# Patient Record
Sex: Male | Born: 1965 | Race: White | Hispanic: No | Marital: Single | Smoking: Former smoker
Health system: Southern US, Community
[De-identification: ages and names within clinical notes are randomized; demographics above are authoritative.]

## PROBLEM LIST (undated history)

## (undated) DIAGNOSIS — Z86718 Personal history of other venous thrombosis and embolism: Secondary | ICD-10-CM

## (undated) DIAGNOSIS — T7840XA Allergy, unspecified, initial encounter: Secondary | ICD-10-CM

## (undated) DIAGNOSIS — D649 Anemia, unspecified: Secondary | ICD-10-CM

## (undated) DIAGNOSIS — IMO0002 Reserved for concepts with insufficient information to code with codable children: Secondary | ICD-10-CM

## (undated) DIAGNOSIS — G4733 Obstructive sleep apnea (adult) (pediatric): Secondary | ICD-10-CM

## (undated) DIAGNOSIS — K8020 Calculus of gallbladder without cholecystitis without obstruction: Secondary | ICD-10-CM

## (undated) DIAGNOSIS — K219 Gastro-esophageal reflux disease without esophagitis: Secondary | ICD-10-CM

## (undated) DIAGNOSIS — K5790 Diverticulosis of intestine, part unspecified, without perforation or abscess without bleeding: Secondary | ICD-10-CM

## (undated) DIAGNOSIS — K760 Fatty (change of) liver, not elsewhere classified: Secondary | ICD-10-CM

## (undated) DIAGNOSIS — K449 Diaphragmatic hernia without obstruction or gangrene: Secondary | ICD-10-CM

## (undated) DIAGNOSIS — K2990 Gastroduodenitis, unspecified, without bleeding: Secondary | ICD-10-CM

## (undated) DIAGNOSIS — Z800 Family history of malignant neoplasm of digestive organs: Secondary | ICD-10-CM

## (undated) DIAGNOSIS — F419 Anxiety disorder, unspecified: Secondary | ICD-10-CM

## (undated) DIAGNOSIS — K2970 Gastritis, unspecified, without bleeding: Secondary | ICD-10-CM

## (undated) DIAGNOSIS — K635 Polyp of colon: Secondary | ICD-10-CM

## (undated) DIAGNOSIS — K5792 Diverticulitis of intestine, part unspecified, without perforation or abscess without bleeding: Secondary | ICD-10-CM

## (undated) HISTORY — DX: Diverticulitis of intestine, part unspecified, without perforation or abscess without bleeding: K57.92

## (undated) HISTORY — DX: Calculus of gallbladder without cholecystitis without obstruction: K80.20

## (undated) HISTORY — DX: Polyp of colon: K63.5

## (undated) HISTORY — DX: Anemia, unspecified: D64.9

## (undated) HISTORY — DX: Gastroduodenitis, unspecified, without bleeding: K29.90

## (undated) HISTORY — PX: WISDOM TOOTH EXTRACTION: SHX21

## (undated) HISTORY — DX: Personal history of other venous thrombosis and embolism: Z86.718

## (undated) HISTORY — DX: Family history of malignant neoplasm of digestive organs: Z80.0

## (undated) HISTORY — DX: Allergy, unspecified, initial encounter: T78.40XA

## (undated) HISTORY — DX: Anxiety disorder, unspecified: F41.9

## (undated) HISTORY — PX: ELBOW SURGERY: SHX618

## (undated) HISTORY — DX: Gastritis, unspecified, without bleeding: K29.70

## (undated) HISTORY — DX: Diverticulosis of intestine, part unspecified, without perforation or abscess without bleeding: K57.90

## (undated) HISTORY — DX: Reserved for concepts with insufficient information to code with codable children: IMO0002

## (undated) HISTORY — DX: Diaphragmatic hernia without obstruction or gangrene: K44.9

## (undated) HISTORY — DX: Fatty (change of) liver, not elsewhere classified: K76.0

## (undated) HISTORY — DX: Obstructive sleep apnea (adult) (pediatric): G47.33

## (undated) HISTORY — DX: Gastro-esophageal reflux disease without esophagitis: K21.9

---

## 2002-03-07 ENCOUNTER — Encounter: Payer: Self-pay | Admitting: Emergency Medicine

## 2002-03-07 ENCOUNTER — Emergency Department (HOSPITAL_COMMUNITY): Admission: EM | Admit: 2002-03-07 | Discharge: 2002-03-07 | Payer: Self-pay | Admitting: Emergency Medicine

## 2002-03-09 ENCOUNTER — Encounter: Payer: Self-pay | Admitting: Orthopedic Surgery

## 2002-03-09 ENCOUNTER — Ambulatory Visit (HOSPITAL_COMMUNITY): Admission: RE | Admit: 2002-03-09 | Discharge: 2002-03-09 | Payer: Self-pay | Admitting: Orthopedic Surgery

## 2002-03-14 ENCOUNTER — Encounter: Payer: Self-pay | Admitting: Orthopedic Surgery

## 2002-03-14 ENCOUNTER — Ambulatory Visit (HOSPITAL_COMMUNITY): Admission: RE | Admit: 2002-03-14 | Discharge: 2002-03-14 | Payer: Self-pay | Admitting: Orthopedic Surgery

## 2002-04-24 ENCOUNTER — Inpatient Hospital Stay (HOSPITAL_COMMUNITY): Admission: RE | Admit: 2002-04-24 | Discharge: 2002-04-25 | Payer: Self-pay | Admitting: Orthopedic Surgery

## 2003-07-27 ENCOUNTER — Ambulatory Visit (HOSPITAL_COMMUNITY): Admission: RE | Admit: 2003-07-27 | Discharge: 2003-07-27 | Payer: Self-pay | Admitting: Gastroenterology

## 2003-07-30 ENCOUNTER — Inpatient Hospital Stay (HOSPITAL_COMMUNITY): Admission: EM | Admit: 2003-07-30 | Discharge: 2003-07-31 | Payer: Self-pay | Admitting: Emergency Medicine

## 2004-10-07 ENCOUNTER — Ambulatory Visit: Payer: Self-pay | Admitting: Gastroenterology

## 2004-10-28 ENCOUNTER — Ambulatory Visit: Payer: Self-pay | Admitting: Gastroenterology

## 2004-11-25 ENCOUNTER — Ambulatory Visit: Payer: Self-pay | Admitting: Gastroenterology

## 2005-08-04 ENCOUNTER — Ambulatory Visit: Payer: Self-pay | Admitting: Cardiology

## 2005-08-05 ENCOUNTER — Ambulatory Visit: Payer: Self-pay | Admitting: Pulmonary Disease

## 2005-08-13 ENCOUNTER — Ambulatory Visit: Payer: Self-pay | Admitting: Cardiology

## 2005-08-20 ENCOUNTER — Ambulatory Visit: Payer: Self-pay | Admitting: Cardiology

## 2005-08-27 ENCOUNTER — Ambulatory Visit: Payer: Self-pay | Admitting: *Deleted

## 2005-09-14 DIAGNOSIS — K2990 Gastroduodenitis, unspecified, without bleeding: Secondary | ICD-10-CM

## 2005-09-14 DIAGNOSIS — K2970 Gastritis, unspecified, without bleeding: Secondary | ICD-10-CM

## 2005-09-14 DIAGNOSIS — K449 Diaphragmatic hernia without obstruction or gangrene: Secondary | ICD-10-CM

## 2005-09-14 HISTORY — DX: Diaphragmatic hernia without obstruction or gangrene: K44.9

## 2005-09-14 HISTORY — DX: Gastritis, unspecified, without bleeding: K29.70

## 2005-09-14 HISTORY — DX: Gastroduodenitis, unspecified, without bleeding: K29.90

## 2005-09-18 ENCOUNTER — Ambulatory Visit: Payer: Self-pay | Admitting: Cardiology

## 2005-11-12 ENCOUNTER — Ambulatory Visit: Payer: Self-pay | Admitting: Cardiology

## 2005-12-10 ENCOUNTER — Ambulatory Visit: Payer: Self-pay | Admitting: Cardiology

## 2005-12-11 ENCOUNTER — Emergency Department (HOSPITAL_COMMUNITY): Admission: EM | Admit: 2005-12-11 | Discharge: 2005-12-12 | Payer: Self-pay | Admitting: Emergency Medicine

## 2005-12-15 ENCOUNTER — Ambulatory Visit: Payer: Self-pay | Admitting: Gastroenterology

## 2005-12-16 ENCOUNTER — Ambulatory Visit: Payer: Self-pay | Admitting: Gastroenterology

## 2006-01-06 ENCOUNTER — Ambulatory Visit: Payer: Self-pay | Admitting: Pulmonary Disease

## 2006-04-02 ENCOUNTER — Emergency Department (HOSPITAL_COMMUNITY): Admission: EM | Admit: 2006-04-02 | Discharge: 2006-04-03 | Payer: Self-pay | Admitting: Emergency Medicine

## 2010-04-17 ENCOUNTER — Encounter: Payer: Self-pay | Admitting: Nurse Practitioner

## 2010-04-21 ENCOUNTER — Encounter: Payer: Self-pay | Admitting: Nurse Practitioner

## 2010-04-22 ENCOUNTER — Telehealth: Payer: Self-pay | Admitting: Gastroenterology

## 2010-04-24 ENCOUNTER — Ambulatory Visit: Payer: Self-pay | Admitting: Gastroenterology

## 2010-04-24 ENCOUNTER — Encounter: Payer: Self-pay | Admitting: Nurse Practitioner

## 2010-04-24 DIAGNOSIS — R195 Other fecal abnormalities: Secondary | ICD-10-CM

## 2010-04-24 DIAGNOSIS — K219 Gastro-esophageal reflux disease without esophagitis: Secondary | ICD-10-CM

## 2010-04-24 DIAGNOSIS — R944 Abnormal results of kidney function studies: Secondary | ICD-10-CM | POA: Insufficient documentation

## 2010-04-24 DIAGNOSIS — R1013 Epigastric pain: Secondary | ICD-10-CM

## 2010-05-07 ENCOUNTER — Telehealth: Payer: Self-pay | Admitting: Gastroenterology

## 2010-05-07 ENCOUNTER — Ambulatory Visit: Payer: Self-pay | Admitting: Gastroenterology

## 2010-05-07 LAB — CONVERTED CEMR LAB: UREASE: NEGATIVE

## 2010-05-08 ENCOUNTER — Ambulatory Visit (HOSPITAL_COMMUNITY): Admission: RE | Admit: 2010-05-08 | Discharge: 2010-05-08 | Payer: Self-pay | Admitting: Gastroenterology

## 2010-05-09 ENCOUNTER — Encounter: Payer: Self-pay | Admitting: Gastroenterology

## 2010-10-14 NOTE — Letter (Signed)
Summary: Patient [facility] Biopsy Results  [facility]  [address] [place], [place] [address]   Phone: [phone]  Fax: [phone]        May 09, 2010 MRN: [id number]    Ronald Mcintosh [address] [place], [place]  [address]    Dear Mr. Bitton,  I am pleased to inform you that the biopsies taken during your recent endoscopic examination did not show any evidence of cancer upon pathologic examination.  Additional information/recommendations:  __No further action is needed at this time.  Please follow-up with      your primary care physician for your other healthcare needs.  __ Please call [phone] to schedule a return visit to review      your condition.  x__ Continue with the treatment plan as outlined on the day of your      exam.No H. pylori noted on gastric biopsy  __ You should have a repeat endoscopic examination for this problem              in _ months/years.   Please call us if you are having persistent problems or have questions about your condition that have not been fully answered at this time.  Sincerely,  Mardella Layman MD [facility]  This letter has been electronically signed by your physician.  Appended Document: Patient Notice-Endo Biopsy Results letter mailed

## 2010-10-14 NOTE — Progress Notes (Signed)
Summary: [place] needed  Phone Note Outgoing Call   Summary of Call: Per Dr. Jarold Motto.  Pt needs [place] abd this week.  See EGD report. Initial call taken by: Ashok Cordia RN,  May 07, 2010 11:23 AM  Follow-up for Phone Call        [place] scheduled.  order completed. Nurse in [facility] notified. Follow-up by: Ashok Cordia RN,  May 07, 2010 11:25 AM

## 2010-10-14 NOTE — Progress Notes (Signed)
Summary: triage  Phone Note Call from Patient Call back at [phone]   Caller: Patient Call For: Jarold Motto Reason for Call: Talk to Nurse Summary of Call: Patient wants to be seen before his appt 9-20 for blood in stools and abd pain. Initial call taken by: Tawni Levy,  April 22, 2010 5:00 PM  Follow-up for Phone Call        Pt has been having pain in mid abd and has noticed some blood in stool for past 2 weeks.  Pt went to PCP and did stool chedks X3.  All were positive.  PCP suggested pt see GI.  Appt sch for pt to see Rozetta Nunnery 04/24/10. Follow-up by: Ashok Cordia RN,  April 23, 2010 8:48 AM

## 2010-10-14 NOTE — Miscellaneous (Signed)
Summary: Clotest  Clinical Lists Changes  Orders: Added new Test order of TLB-H Pylori Screen Gastric Biopsy (83013-CLOTEST) - Signed 

## 2010-10-14 NOTE — Assessment & Plan Note (Signed)
Summary: Abd pain/ Blood in stool/ dfs   History of Present Illness Visit Type: Initial Visit Primary GI MD: Melvia Heaps MD [facility] Primary Provider: [facility] Chief Complaint: Abdominal pain, hem positive stool History of Present Illness:   Last seen in 2007 for abdominal pain, black, heme positive stools on Coumadin. EGD revealed gastritis. Patient's hemoglobin stayed in normal range at the time. Patient has a history of GERD, asymptomatic if sleeps with HOB elevated and takes PPI twice daily.   Patient referred here by PCP for evaluation of heme positive stool. Saw PCP for two week history of epigastric pain and "dark stool", patient tells me it was black.  Pain felt similiar to what he experienced in 2007 except this time it radiated through to mid back. For last three days he has felt fine. He works out so much, not sure if back pain secondary to that. No nausea or vomiting. Labs 04/21/10 reveal Hgb of 17, MCV of 86. BUN normal, creatinine elevated at 1.54. On Cipro since Monday.    GI Review of Systems    Reports abdominal pain.     Location of  Abdominal pain: epigastric area.    Denies acid reflux, belching, bloating, chest pain, dysphagia with liquids, dysphagia with solids, heartburn, loss of appetite, nausea, vomiting, vomiting blood, weight loss, and  weight gain.      Reports heme positive stool.     Denies anal fissure, black tarry stools, change in bowel habit, constipation, diarrhea, diverticulosis, fecal incontinence, hemorrhoids, irritable bowel syndrome, jaundice, light color stool, liver problems, rectal bleeding, and  rectal pain. Preventive Screening-Counseling & Management  Alcohol-Tobacco     Smoking Status: quit      Drug Use:  no.      Current Medications (verified): 1)  Protonix 40 Mg Tbec (Pantoprazole Sodium) .... Two Times A Day 2)  Wellbutrin Xl 300 Mg Xr24h-Tab (Bupropion Hcl) .... Once Daily 3)  Flomax 0.4 Mg Caps (Tamsulosin Hcl) .... At  Bedtime 4)  Testim 1 % Gel (Testosterone) .... Apply As Directed 5)  Cipro 500 Mg Tabs (Ciprofloxacin Hcl) .... Two Times A Day  Allergies (verified): No Known Drug Allergies  Past History:  Past Medical History: hiatal hernia GI Bleed(2007) ? 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  Alcohol Use - no Daily Caffeine Use Illicit Drug Use - no Recovering addict crack  cocaineSmoking Status:  quit Drug Use:  no  Review of Systems       The patient complains of depression-new and fatigue.  The patient denies allergy/sinus, anemia, anxiety-new, arthritis/joint pain, back pain, blood in urine, breast changes/lumps, change in vision, confusion, cough, coughing up blood, fainting, fever, headaches-new, hearing problems, heart murmur, heart rhythm changes, itching, menstrual pain, muscle pains/cramps, night sweats, nosebleeds, pregnancy symptoms, shortness of breath, skin rash, sleeping problems, sore throat, swelling of feet/legs, swollen lymph glands, thirst - excessive , urination - excessive , urination changes/pain, urine leakage, vision changes, and voice change.    Vital Signs:  Patient profile:   45 year old male Height:      67 inches Weight:      217.50 pounds BMI:     34.19 Pulse rate:   88 / minute Pulse rhythm:   regular BP sitting:   112 / 68  (left arm) Cuff size:   large  Vitals Entered By: June  McMurray CMA Duncan Dull) (April 24, 2010 2:08 PM)  Physical Exam  General:  Well developed, well nourished, no acute distress. Head:  Normocephalic and atraumatic. Eyes:  Conjunctiva pink, no icterus.  Neck:  no obvious masses  Lungs:  Clear throughout to auscultation. Heart:  Regular rate and rhythm; no murmurs, rubs,  or bruits. Abdomen:  Abdomen soft, nontender, nondistended. No obvious masses or hepatomegaly.Normal bowel  sounds.  Msk:  Symmetrical with no gross deformities. Normal posture. Extremities:  No palmar erythema, no edema.  Neurologic:  Alert and  oriented x4;  grossly normal neurologically. Skin:  Intact without significant lesions or rashes. Cervical Nodes:  No significant cervical adenopathy. Psych:  Alert and cooperative. Normal mood and affect.   Impression & Recommendations:  Problem # 1:  BLOOD IN STOOL, OCCULT (ICD-792.1) Assessment New Passage of one black stool recently. CBC normal. History of low volume GI bleed on Coumadin in 2007. EGD revealed only gastritis. Patient is having recurrent epigastric pain and has been taking a small amount of NSAIDs. He is on PPI but PUD isn't excluded. Rule out biliary disease as this time pain seems to radiate through to back. The patient will be scheduled for an EGD with biopsies ( if indicated).  The risks and benefits of the procedure, as well as alternatives were discussed with the patient and he agrees to proceed. If EGD negative may need U/S abdomen for further evaluation of pain and colonoscopy for evaluation of heme + stool.   (EGD)  Problem # 2:  ABDOMINAL PAIN-EPIGASTRIC (ICD-789.06) Assessment: Deteriorated See #1. Patient started on Cipro, I am not sure why but will continue that under direction of PCP.  Problem # 3:  NONSPECIFIC ABNORM RESULTS KIDNEY FUNCTION STUDY (ICD-794.4) Assessment: Comment Only Creatinine 1.54 on 04/21/10.  Problem # 4:  GERD (ICD-530.81) Assessment: Comment Only Asymptomatic on twice daily PPI>  Other Orders: EGD (EGD)  Patient Instructions: 1)  Stop taking whatever NSAIDS you are taking such as Advil, Aleve.  2)  We scheduled you for an Endoscopy with Dr. Sheryn Bison. 3)  Directions and brochure provided. 4)   Endoscopy Center Patient Information Guide given to patient. 5)  cc: [facility] 6)  The medication list was reviewed and reconciled.  All changed / newly prescribed  medications were explained.  A complete medication list was provided to the patient / caregiver.

## 2010-10-14 NOTE — Procedures (Signed)
Summary: Upper Endoscopy  Patient: Ronald Mcintosh Note: All result statuses are Final unless otherwise noted.  Tests: (1) Upper Endoscopy (EGD)   EGD Upper Endoscopy       DONE     [facility]     [address]     [place], [place]  [address]           ENDOSCOPY PROCEDURE REPORT           PATIENT:  [name], [name]  MR#:  [id number]     BIRTHDATE:  20-Feb-1966, 44 yrs. old  GENDER:  male           ENDOSCOPIST:  Vania Rea. Jarold Motto, MD, [facility]     Referred by:           PROCEDURE DATE:  05/07/2010     PROCEDURE:  EGD with biopsy     ASA CLASS:  Class II     INDICATIONS:  abdominal pain, melena           MEDICATIONS:   Fentanyl 75 mcg IV, Versed 9 mg IV     TOPICAL ANESTHETIC:  Exactacain Spray           DESCRIPTION OF PROCEDURE:   After the risks benefits and     alternatives of the procedure were thoroughly explained, informed     consent was obtained.  The LB GIF-H180 [id number] endoscope was     introduced through the mouth and advanced to the second portion of     the duodenum, without limitations.  The instrument was slowly     withdrawn as the mucosa was fully examined.     <<PROCEDUREIMAGES>>           Normal GE junction was noted.  Normal duodenal folds were noted.     The stomach was entered and closely examined. The antrum,     angularis, and lesser curvature were well visualized, including a     retroflexed view of the cardia and fundus. The stomach wall was     normally distensable. The scope passed easily through the pylorus     into the duodenum. gastric polyps biopsied and CLO BX. DONE.     Retroflexed views revealed no abnormalities.    The scope was then     withdrawn from the patient and the procedure completed.     COMPLICATIONS:  None           ENDOSCOPIC IMPRESSION:     1) Normal GE junction     2) Normal duodenal folds     3) Normal stomach     NO CAUSE FOR ++ STOOLS.R/O LOWER GI SOURCE.     RECOMMENDATIONS:     1) Await biopsy results  SCHEDULE COLON EXAM.           REPEAT EXAM:  No           ______________________________     Vania Rea. Jarold Motto, MD, [facility]           CC:           n.     eSIGNED:   Vania Rea. Patterson at 05/07/2010 11:04 AM           Nicole Kindred, [id number]  Note: An exclamation mark (!) indicates a result that was not dispersed into the flowsheet. Document Creation Date: 05/07/2010 12:20 PM _______________________________________________________________________  (1) Order result status: Final Collection or observation date-time: 05/07/2010 10:57 Requested date-time:  Receipt date-time:  Reported date-time:  Referring Physician:   Ordering Physician: Sheryn Bison [id number]) Specimen Source:  Source: Launa Grill Order Number: [id number] Lab site:

## 2010-10-14 NOTE — Letter (Signed)
Summary: [facility]  [facility]   Imported By: Sherian Rein 05/01/2010 09:21:13  _____________________________________________________________________  External Attachment:    Type:   Image     Comment:   External Document

## 2010-10-14 NOTE — Letter (Signed)
Summary: EGD Instructions  [facility]  [address] [place], [place] [address]   Phone: [phone]  Fax: [phone]       Ronald Mcintosh    02/06/1966    MRN: [id number]       Procedure Day /Date: 05-07-10     Arrival Time: 9:00 AM      Procedure Time: 10:00 AM     Location of Procedure:                    X    [facility] (4th Floor) PREPARATION FOR ENDOSCOPY   On 05-07-10 THE DAY OF THE PROCEDURE:  1.   No solid foods, milk or milk products are allowed after midnight the night before your procedure.  2.   Do not drink anything colored red or purple.  Avoid juices with pulp.  No orange juice.  3.  You may drink clear liquids until 8:00 AM  which is 2 hours before your procedure.                                                                                                CLEAR LIQUIDS INCLUDE: Water Jello Ice Popsicles Tea (sugar ok, no milk/cream) Powdered fruit flavored drinks Coffee (sugar ok, no milk/cream) Gatorade Juice: apple, white grape, white cranberry  Lemonade Clear bullion, consomm, broth Carbonated beverages (any kind) Strained chicken noodle soup Hard Candy   MEDICATION INSTRUCTIONS  Unless otherwise instructed, you should take regular prescription medications with a small sip of water as early as possible the morning of your procedure.         OTHER INSTRUCTIONS  You will need a responsible adult at least 46 years of age to accompany you and drive you home.   This person must remain in the waiting room during your procedure.  Wear loose fitting clothing that is easily removed.  Leave jewelry and other valuables at home.  However, you may wish to bring a book to read or an iPod/MP3 player to listen to music as you wait for your procedure to start.  Remove all body piercing jewelry and leave at home.  Total time from sign-in until discharge is approximately 2-3 hours.  You should go home directly after your  procedure and rest.  You can resume normal activities the day after your procedure.  The day of your procedure you should not:   Drive   Make legal decisions   Operate machinery   Drink alcohol   Return to work  You will receive specific instructions about eating, activities and medications before you leave.    The above instructions have been reviewed and explained to me by   _______________________    I fully understand and can verbalize these instructions _____________________________ Date _________

## 2010-10-14 NOTE — Letter (Signed)
Summary: [facility]  [facility]   Imported By: Sherian Rein 05/01/2010 09:19:39  _____________________________________________________________________  External Attachment:    Type:   Image     Comment:   External Document

## 2010-10-14 NOTE — Procedures (Signed)
Summary: EGD Report   EGD  Procedure date:  12/16/2005  Findings:      Findings: Gastritis  Location: [facility]   Patient Name: Ronald Mcintosh, Ronald Mcintosh MRN:  Procedure Procedures: Panendoscopy (EGD) CPT: 43235.  Personnel: Endoscopist: Vania Rea. Jarold Motto, MD.  Exam Location: Exam performed in Outpatient Clinic. Outpatient  Patient Consent: Procedure, Alternatives, Risks and Benefits discussed, consent obtained, from patient. Consent was obtained by the RN.  Indications  Evaluation of Suspected: Upper GI bleed.  History  Current Medications: Patient is not currently taking Coumadin.  Pre-Exam Physical: Performed Dec 16, 2005  Cardio-pulmonary exam, Abdominal exam, Extremity exam, Mental status exam WNL.  Comments: Pt. history reviewed/updated, physical exam performed prior to initiation of sedation? Exam Exam Info: Maximum depth of insertion Duodenum, intended Duodenum. Patient position: on left side. Duration of exam: 15 minutes. Vocal cords visualized. Gastric retroflexion performed. Images taken. ASA Classification: I. Tolerance: excellent.  Sedation Meds: Patient assessed and found to be appropriate for moderate (conscious) sedation. Fentanyl 50 mcg. given IV. Versed 7 mg. given IV. Cetacaine Spray 2 sprays given aerosolized.  Monitoring: BP and pulse monitoring done. Oximetry used. Supplemental O2 given at 2 Liters.  Instrument(s): GIF 160. Serial [id number].   Findings - HIATAL HERNIA: Prolapsing, 3 cms. in length. ICD9: Hernia, Hiatal: 553.3. - Normal: Proximal Esophagus to Distal Esophagus. Not Seen: Tumor. Barrett's esophagus. Esophageal inflammation. Mucosal abnormality. Stricture. Varices.  - Normal: Fundus to Body. Ulcer. Mucosal abnormality. Foreign body. Varices.  - Normal: Duodenal Bulb to Duodenal 2nd Portion. Ulcer. Mucosal abnormality. AVM's.  - MUCOSAL ABNORMALITY: Body to Antrum. Erythematous mucosa. Granular mucosa. RUT done,  results pending. ICD9: Gastritis, Unspecified: 535. 50. Comment: R/O H.pylori.   Assessment  Diagnoses: 553.3: Hernia, Hiatal.  535.50: Gastritis, Unspecified.   Comments: Patient is on coumadin and was taking NSAIDS.Marland KitchenMarland KitchenI suspect prior erosive mucosal disease has healed. Events  Unplanned Intervention: No unplanned interventions were required.  Plans Medication(s): Await pathology. Referring Lani Mendiola to order medications.  Comments: Resume coumadin today and check with the coumadin clinic... Disposition: After procedure patient sent to recovery. After recovery patient sent home.  Scheduling: Clinic Visit, to Vania Rea. Jarold Motto, MD, around Dec 30, 2005.    This report was created from the original endoscopy report, which was reviewed and signed by the above listed endoscopist.

## 2011-01-30 NOTE — H&P (Signed)
NAME:  Ronald Mcintosh, Ronald Mcintosh                       ACCOUNT NO.:  [id number]   MEDICAL RECORD NO.:  [id number]                   PATIENT TYPE:  EMS   LOCATION:  ED                                   FACILITY:  [facility]   PHYSICIAN:  Charlton Haws, M.D.                  DATE OF BIRTH:  1966/01/07   DATE OF ADMISSION:  07/30/2003  DATE OF DISCHARGE:                                HISTORY & PHYSICAL   REASON FOR ADMISSION:  Chest pain and tachypalpitations.   HISTORY OF PRESENT ILLNESS:  Ronald Mcintosh is a 45 year old patient who was just  seen by Dr. Gerri Spore in the office on July 26, 2003.   The patient has a history of GERD.  He is undergoing a GI workup.  He  apparently just had a CT scan for question of a focal spot on his liver.  He  has a history of polysubstance abuse.   He also has a history of anabolic steroids and weight loss.   Dr. Gerri Spore saw him on July 26, 2003.  He had a Cardiolite study read  by Dr. Myrtis Ser as possible anterior wall defect plus or minus ischemia.  Dr.  Myrtis Ser seemed to indicate that the abnormality may be secondary to his large  chest wall, but could not call it normal.  The EF was 47%.   The patient was to follow up in the office this week with an echocardiogram,  but presented to the emergency room with tachypalpitations and somewhat  atypical chest pain.  His heart had been skipping beats for a few hours.  He  had 2/10 chest pain with occasional shortness of breath.   His symptoms have been going on for two to three months, but today was  worse.  He has also had diarrhea for a few days.   REVIEW OF SYSTEMS:  Otherwise remarkable for being on testosterone for low  testosterone levels.  He has an endocrine followup appointment.   PAST MEDICAL HISTORY:  The patient has had left elbow and rotator cuff  surgery before.   MEDICATIONS:  He is on Protonix, testosterone, an aspirin a day, and  multivitamins.   PHYSICAL EXAMINATION:  GENERAL APPEARANCE:   He is in no distress.  VITAL SIGNS:  The blood pressure was 136/79 and pulse 82 and regular.  LUNGS:  Clear.  CARDIAC:  Normal S1 and S2 with a soft systolic murmur.  ABDOMEN:  Benign.  EXTREMITIES:  Lower extremity pulses intact.  No edema.   LABORATORY DATA:  Initially unremarkable.  The troponin was 0.22 and the CPK  was 329 with an MB of 4.4.  Coagulation times were normal.   The chest x-ray is pending.  His EKG is normal.   IMPRESSION:  Recurrent atypical chest pain, however, the patient does have  an abnormal Cardiolite with previous substance abuse.  We will check a  toxicology screen.  I discussed the case with Dr. Gerri Spore, who wishes the patient to have  heart catheterization.  The risks were discussed with the patient and his  wife.   Of note, the patient is somewhat under stress.  He is a Naval architect.  He is  separated from his wife.   The patient is agreeable to having heart catheterization in the morning.   Further recommendations will be based on these results.  He has had contrast  dye for his CT scan.  He is not allergic to iodine and did not have any  adverse reactions to this.                                               Charlton Haws, M.D.    PN/MEDQ  D:  07/30/2003  T:  07/30/2003  Job:  [id number]   cc:   Georgina Quint. Plotnikov, M.D. [facility]   Carole Binning, M.D. [facility]

## 2011-01-30 NOTE — Discharge Summary (Signed)
NAMESALLY, REIMERS                       ACCOUNT NO.:  [id number]   MEDICAL RECORD NO.:  [id number]                   PATIENT TYPE:  INP   LOCATION:  0347                                 FACILITY:  [facility]   PHYSICIAN:  Charlton Haws, M.D.                  DATE OF BIRTH:  08/13/1966   DATE OF ADMISSION:  07/30/2003  DATE OF DISCHARGE:  07/31/2003                                 DISCHARGE SUMMARY   PROCEDURES:  1. Cardiac catheterization.  2. Coronary arteriogram.  3. Left ventriculogram.   HISTORY OF PRESENT ILLNESS:  Ronald Mcintosh is a 45 year old male with no known  history of coronary artery disease.  He has a history of reflux symptoms and  is scheduled to get an esophagogastroduodenoscopy on August 07, 2003, by  Dr. Jarold Motto.  He saw his primary M.D. and was complaining of  tachypalpitations and chest pain.  He had an exercise treadmill Cardiolite  which showed an EF of 47% with a questionable defect.  He saw Dr. Gerri Spore  in the office and has an echocardiogram scheduled on August 07, 2003.   On the day of admission, he had onset at 9 a.m. of tachypalpitations, as  well as 2/10 chest pain described as a pressure and associated with  shortness of breath.  His symptoms did not resolve over the next two or  three hours and he came to the emergency room where he got relief after  receiving IV nitroglycerin, aspirin, and heparin.  He also complained of  recent diarrhea, two to three episode per day, but this is controlled by  over the counter medications.  He was admitted for further evaluation and  treatment.   HOSPITAL COURSE:  His enzymes were negative for MI. It was felt that he  needed definitive evaluation and this was done by cardiac catheterization.  The cardiac catheterization was performed on July 31, 2003, and it  showed a normal left main, an LAD with a 30% distal lesion, and no disease  in the circumflex or the RCA.  His EF was 55% with no MR.  Dr. Eden Emms  evaluated him and felt that he was having noncardiac chest pain and could  follow up with his primary care physician.  Additionally a D-dimer was  checked and it was within normal limits.  Dr. Eden Emms evaluated the films and  felt that his symptoms were not cardiac.  He also felt that since his  palpitations had resolved and his EF was within normal limits, history of  further evaluation was needed for them at this time.  Ronald Mcintosh was stable  post catheterization and ambulating without chest pain or shortness of  breath.  He had no hematoma or significant ecchymosis in his groin.  He was  considered stable for discharge on July 31, 2003, and is to follow up in  the office with primary care physician and keep his GI appointment  for  endoscopy.   LABORATORY VALUES:  Stool for ova and parasites, as well as Clostridium  difficile, was negative.  Fecal occult blood was positive.  Hemoglobin 17.9,  hematocrit 51.1, WBC 9.5, platelets 240.  Sodium 137, potassium 4.6,  chloride 106, CO2 25, BUN 17, creatinine 1.3, glucose 92.  Total bilirubin  1.3, alkaline phosphatase 62, SGOT 41, SGPT 45, total protein 6.9, albumin  3.9, calcium 9.3.   Chest x-ray with slight cardiomegaly with early congestion without other  significant findings.   DISCHARGE CONDITION:  Improved.   DISCHARGE DIAGNOSES:  1. Chest pain, negative D-dimer for pulmonary embolus and negative     catheterization for significant coronary artery disease.  2. Minimal coronary artery disease with a distal left anterior descending     30% and ejection fraction of 55% by catheterization this admission.  3. Gastrointestinal reflux disease, history of peptic ulcer disease with     heme-positive stools this admission.  4. Spot on liver.  CT of the abdomen shows no abnormality, but some fatty     infiltration.  5. Low testosterone levels.  He has a scheduled appointment with an     endocrinologist.  6. Remote history of polysubstance  abuse and anabolic steroid use.  7. Family history of supraventricular tachycardia in a grandfather with     premature coronary artery disease.   ACTIVITY:  His activity level is to include no driving or sexual or  strenuous activity for two days.   WOUND CARE:  He is to call the office for problems with the catheterization  site.   DIET:  He is to stick to a low-fat diet.   FOLLOWUP:  He is to follow up with Dr. Eden Emms as needed, with Dr. Posey Rea,  and with Dr. Jarold Motto as scheduled, as well as with endocrinology.   DISCHARGE MEDICATIONS:  1. Protonix 40 mg p.o. daily.  2. Aspirin 81 mg daily.  3. Multivitamins as prior to admission.  4. Testosterone as prior to admission.      Lavella Hammock, P.A. [facility]                  Charlton Haws, M.D.    RG/MEDQ  D:  07/31/2003  T:  08/01/2003  Job:  [id number]   cc:   Vania Rea. Jarold Motto, M.D. [facility]   Aleksei V. Plotnikov, M.D. [facility]

## 2011-01-30 NOTE — H&P (Signed)
NAME:  Ronald Mcintosh, Ronald Mcintosh                       ACCOUNT NO.:  [id number]   MEDICAL RECORD NO.:  [id number]                   PATIENT TYPE:  INP   LOCATION:  NA                                   FACILITY:  [facility]   PHYSICIAN:  James P. Aplington, M.D.            DATE OF BIRTH:  December 23, 1965   DATE OF ADMISSION:  04/24/2002  DATE OF DISCHARGE:                                HISTORY & PHYSICAL   CHIEF COMPLAINT:  Left shoulder and elbow pain.   HISTORY OF PRESENT ILLNESS:  This is a 45 year old male who sustained an  injury while lifting weights to his left shoulder and elbow.  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  He developed  continued pain.  He was seen and evaluated by Dr. Simonne Come.  Had an MRI  study which did reveal a left rotator cuff tear and tear of the triceps  tendon at the left elbow.  It was felt he would benefit from undergoing  surgical intervention.  The risks and benefits as well as the procedure were  discussed with the patient, and he elected to proceed.   ALLERGIES:  No known drug allergies.   MEDICATIONS:  1. Naprosyn p.r.n.  2. Protonix 40 mg 1 p.o. q.d.   PAST MEDICAL HISTORY:  Significant for GERD.  He reports he has been in good  health.   PAST SURGICAL HISTORY:  Benign cyst removed from his ear at the age of 45  years old.   SOCIAL HISTORY:  Denies any alcohol or tobacco use.  He has two children.  He is single.  He is a Naval architect.  He is right-hand dominant.  He is an  avid Licensed conveyancer, as mentioned up above.   FAMILY HISTORY:  Mother living, age 11, history of hypertension.   REVIEW OF SYSTEMS:  GENERAL:  No fevers, chills, night sweats, or bleeding  tendencies.  PULMONARY:  No shortness of breath, productive cough,  hemoptysis.  CARDIOVASCULAR:  No chest pain, angina, orthopnea.  ENDOCRINE:  No history of hypo- or hyperthyroidism.  No  diabetes mellitus.  GENITOURINARY:  No hematuria, dysuria, or discharge.  GASTROINTESTINAL:  No  nausea, vomiting, diarrhea, or constipation.  PSYCHIATRIC:  No history of  anxiety or depression.  MUSCULOSKELETAL:  Left elbow and shoulder pain as  described up above.   PHYSICAL EXAMINATION:  GENERAL:  Alert and oriented.  Well-developed, well-  nourished for body habitus.  VITAL SIGNS:  Pulse 80, respirations 25, blood pressure 130/90.  HEENT:  Head is atraumatic, normocephalic.  Oropharynx is clear.  NECK:  Supple.  Negative for cervical lymphadenopathy.  CHEST:  Clear to auscultation bilaterally.  No wheezes, rhonchi, or rales.  BREASTS:  Not pertinent to present illness.  HEART:  S1, S2.  Negative for murmur or gallop.  Heart with regular rate and  rhythm.  ABDOMEN:  Soft and nontender.  Positive bowel sounds.  GENITOURINARY:  Not pertinent to present illness.  EXTREMITIES:  Decreased abduction to the left shoulder.  Pain with range of  motion.  Examination of the left elbow shows some triceps atrophy.  Skin is  intact.  No rashes or lesions appreciated on exam.  Radial pulses 2+ and  symmetrical.   LABORATORY DATA:  Preoperative laboratories and x-rays are pending.   IMPRESSION:  1. Possible rotator cuff tear on the left.  2. Tear triceps tendon left elbow.   PLAN:  The patient is scheduled for left shoulder arthroscopy, subacromial  decompression, rotator cuff repair, and repair of triceps tendon on the left  with Dr. Illene Labrador. Aplington.     Bradd Canary Clabe Seal.                       James P. Aplington, M.D.    MWN/UUVO  D:  04/17/2002  T:  04/21/2002  Job:  [id number]

## 2011-01-30 NOTE — Op Note (Signed)
Ronald Mcintosh, Ronald Mcintosh                      ACCOUNT NO.:  [id number]   MEDICAL RECORD NO.:  [id number]                   PATIENT TYPE:  INP   LOCATION:  0450                                 FACILITY:  [facility]   PHYSICIAN:  Illene Labrador. Aplington, M.D.            DATE OF BIRTH:  1965/12/10   DATE OF PROCEDURE:  04/24/2002  DATE OF DISCHARGE:  04/25/2002                                 OPERATIVE REPORT   PREOPERATIVE DIAGNOSES:  1. Torn triceps tendon, left elbow.  2. Partial rotator cuff tear, left shoulder.   POSTOPERATIVE DIAGNOSES:  1. Torn triceps tendon, left elbow.  2. Partial rotator cuff tear, left shoulder.   OPERATION:  1. Repair of torn triceps tendon, left elbow.  2. A left shoulder arthroscopy with:     a. Shaving of the underneath surface of the rotator cuff.     b. Arthroscopic subacromial decompression.   SURGEON:  Illene Labrador. Aplington, M.D.   ASSISTANTDruscilla Brownie. Underwood, P.A.-C.   ANESTHESIA:  General.   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  He subsequently felt he  also had a left shoulder injury.  This ultimately led to MRI's of the left  elbow and the left shoulder.  The MRI of the left elbow demonstrated a tear  of the triceps tendon at its insertion with some retractions.  MRI of the  left shoulder was felt to indicate a partial underneath surface tear, which  was felt to be extensive, almost through the bursal surface and 1.3 cm in  width.  I thoroughly discussed preoperatively his pathology with him.  We  wanted to try and preserve as much function as we could of his left upper  extremity, but he knew that he had extensive injuries and that he might not  able to be competitive with weight lifting.  On the other hand, also wanted  to minimize the chance for recurrent tear of the rotator cuff, and my game  plan for surgery was to see the extent of the rotator cuff  tear and very  possibly complete the tear and do a mini open procedure and reattach the  tendon to the greater tuberosity.  However, at surgery, the underneath  surface tear did not appear to be nearly as extensive as depicted on the  MRI.  There was no leakage of fluid into the subacromial space, and the tear  appeared to be very superficial on the underneath surface, warranting a  little roughening on the superior surface and based on this, I did not feel  that an open procedure was indicated.   PROCEDURE:  Prophylactic antibiotics, placed on the Schlein frame in a flat  position initially.  Pneumatic tourniquet applied to the left upper  extremity, and the arm was Esmarch'd out nonsteriley.  Because of his size,  we put the  arm across the chest with rolls on the anterior chest.  His left  upper extremity from wrist to tourniquet was then prepped with DuraPrep and  draped in a sterile field.  We made a posterior incision on the radial side  of the olecranon, going a few inches proximalward.  I extended this seven  more inches once I got the pathology exposed.  He did have a complete  detachment with a V-shaped tear and after demarcating its extent, I placed a  small drill hole through the olecranon, going from the ulnar side to the  radial side.  The ulnar nerve was not visualized and was protected deep.  I  then interwove a #5 Ethibond suture with a Kessler-type stitch, going from  ulnar to radial with one wing of the suture and then with the arm in some  extension for better tightening up of the triceps, tied the knot on the  suture just proximal to the olecranon and on the ulnar side.  We did not tie  this, however, until I had used multiple interrupted #1 Vicryl sutures,  closing the tear proximal to distal and advancing the tendon as we did so.  Just prior to completing the Vicryl sutures, I tied the Ethibond knot which  I was unable to bury, and complete closure of the triceps  aponeurosis and  tendon on top of it.  This appeared to give a very nice, solid, stable  repair.  The wound was irrigated with sterile saline and subcutaneous tissue  closed with interrupted 2-0 Vicryl and the skin with interrupted 3-0 nylon  mattress sutures.  Betadine, Adaptic dry sterile dressing were applied, and  we kept the back table sterile and went ahead and removed the draping and  the tourniquet and placed him in the upright position on the Schlein frame  for the arthroscopy.  We then prepped his arm and shoulder from the top of  the dressing superiorly in the usual arthroscopic manner and then draped the  left shoulder girdle into a sterile field.  The anatomy of the shoulder was  marked out.  I infiltrated the lateral portal, posterior portal, and  subacromial space with 0.5% Marcaine with adrenaline.  Through a posterior  soft spot stick, I was able to place a blunt cannula into the glenohumeral  joint atraumatically and got an excellent look at the joint; humeral head,  glenoid labrum, and biceps tendon all appeared to be intact.  As discussed  above, the underneath surface fraying of the rotator cuff did not appear to  be deep, although there were multiple areas.  This was depicted.  I then  advanced the scope between the biceps and subscapularis and using a  switching stick and because of his size, I barely had enough length on the  switching stick to make an anterior incision at the level of the coracoid.  I placed a metal cannula over this, advancing into the joint and then used a  4.2 shaver to shave down the underneath surface of the rotator cuff as much  as we possibly could.  I then removed all fluid and redirected the scope  into the subacromial area and through a lateral portal, introduced the 4.2  shaver.  He had extensive bursal tissue which I resected, and I then used the ArthroCare vaporizer to debride down tissue on the underneath surface of  the acromion back  past the C joint.  This included the coracoacromial  ligament.  I then  used a 4.0 bur to bur down the underneath surface of the  acromion after taking initial pictures showing the decreased outlet.  I then  moved back and forth between the bur, the vaporizer, and the 4.2 shaver  until I felt we had a nice decompression, both of the soft tissue and bone.  Final pictures were taken with the arm to the side and the arm abducted.  Then removed all fluid from the subacromial joint and injected once again  the three portals and the subacromial space with 0.5% Marcaine with  adrenaline and closed them with 3-0 nylon.  Dry sterile dressing applied.  I  then placed his left upper extremity in a long arm cast and when this was  dry, placed him in a large shoulder immobilizer.  He tolerated the procedure  well and was taken to the recovery room in satisfactory condition with no  known complications.                                               James P. Aplington, M.D.    JPA/MEDQ  D:  04/24/2002  T:  04/26/2002  Job:  [id number]

## 2012-05-31 ENCOUNTER — Ambulatory Visit: Payer: Self-pay | Admitting: Gastroenterology

## 2012-06-03 ENCOUNTER — Ambulatory Visit (INDEPENDENT_AMBULATORY_CARE_PROVIDER_SITE_OTHER): Payer: BC Managed Care – PPO | Admitting: Gastroenterology

## 2012-06-03 ENCOUNTER — Encounter: Payer: Self-pay | Admitting: Gastroenterology

## 2012-06-03 VITALS — BP 140/76 | HR 80 | Ht 67.0 in | Wt 213.0 lb

## 2012-06-03 DIAGNOSIS — Z800 Family history of malignant neoplasm of digestive organs: Secondary | ICD-10-CM

## 2012-06-03 DIAGNOSIS — K219 Gastro-esophageal reflux disease without esophagitis: Secondary | ICD-10-CM

## 2012-06-03 MED ORDER — DEXLANSOPRAZOLE 60 MG PO CPDR: 60.0000 mg | DELAYED_RELEASE_CAPSULE | Freq: Every day | ORAL | Status: DC

## 2012-06-03 MED ORDER — PEG-KCL-NACL-NASULF-NA ASC-C 100 G PO SOLR: 1.0000 | Freq: Once | ORAL | Status: DC

## 2012-06-03 NOTE — Patient Instructions (Addendum)
You have been scheduled for an endoscopy and colonoscopy with propofol. Please follow the written instructions given to you at your visit today. Please pick up your prep at the pharmacy within the next 1-3 days. If you use inhalers (even only as needed), please bring them with you on the day of your procedure. We have given you samples of the following medication to take: Dexilant. We have sent the following medications to your pharmacy for you to pick up at your convenience: Dexilant.  Cc: Mauricio Po, M. D.

## 2012-06-03 NOTE — Progress Notes (Signed)
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 He has a family history of colon cancer in his mother, and he is due for colonoscopy exam which has not been completed in the past as recommended. He has chronic GERD managed with daily Protonix 40 mg, but has had recent breakthrough symptomatology without dysphagia. He denies any other gastrointestinal or hepatobiliary complaints. Review of his x-ray shows no evidence of previous hepatic abnormalities except for fatty infiltration the liver. I cannot see any previous laboratory information, but he relates this is been normal with his primary care physicians.  Current Medications, Allergies, Past Medical History, Past Surgical History, Family History and Social History were reviewed in [employer] record.  Pertinent Review of Systems Negative   Physical Exam: Healthy-appearing patient in no distress. Blood pressure 140/76, pulse 80 and regular, and weight 213 pounds with a BMI of 33.36. I cannot appreciate stigmata of chronic liver disease. Chest is clear and he is in a regular rhythm without murmurs gallops or rubs. I cannot appreciate hepatosplenomegaly, abdominal masses or tenderness. Bowel sounds are normal. Peripheral extremities are unremarkable and mental status is normal.    Assessment and Plan: Colonoscopy indicated with a family history of colon cancer in his mother, he previously had guaiac positive stools. We'll request his labs from his primary care physician, and schedule endoscopy and colonoscopy. I placed him on at followup Dexilant 60 mg a day in place of Protonix, and reviewed standard antireflux maneuvers. Encounter Diagnoses  Name Primary?  . Family history of colon cancer Yes  . GERD (gastroesophageal reflux disease)

## 2012-06-03 NOTE — Addendum Note (Signed)
Addended by: Venora Maples on: 06/03/2012 01:50 PM   Modules accepted: Orders

## 2012-06-06 ENCOUNTER — Encounter: Payer: Self-pay | Admitting: Gastroenterology

## 2012-07-13 ENCOUNTER — Ambulatory Visit (AMBULATORY_SURGERY_CENTER): Payer: BC Managed Care – PPO | Admitting: Gastroenterology

## 2012-07-13 ENCOUNTER — Encounter: Payer: Self-pay | Admitting: Gastroenterology

## 2012-07-13 VITALS — BP 115/67 | HR 68 | Temp 97.8°F | Resp 12 | Ht 67.0 in | Wt 213.0 lb

## 2012-07-13 DIAGNOSIS — R1013 Epigastric pain: Secondary | ICD-10-CM

## 2012-07-13 DIAGNOSIS — Z800 Family history of malignant neoplasm of digestive organs: Secondary | ICD-10-CM

## 2012-07-13 DIAGNOSIS — K5730 Diverticulosis of large intestine without perforation or abscess without bleeding: Secondary | ICD-10-CM

## 2012-07-13 DIAGNOSIS — K219 Gastro-esophageal reflux disease without esophagitis: Secondary | ICD-10-CM

## 2012-07-13 MED ORDER — SODIUM CHLORIDE 0.9 % IV SOLN: 500.0000 mL | INTRAVENOUS | Status: DC

## 2012-07-13 MED ORDER — DEXLANSOPRAZOLE 60 MG PO CPDR: 60.0000 mg | DELAYED_RELEASE_CAPSULE | Freq: Two times a day (BID) | ORAL | Status: DC

## 2012-07-13 NOTE — Progress Notes (Signed)
No complaints noted in the recovery room. Maw   

## 2012-07-13 NOTE — Patient Instructions (Addendum)
Handouts were given to your care partner on diverticulosis, high fiber diet and hiatal hernia.  Increase the dexilant to 2 x per day.  Rx was sent to [facility].  You may resume your current medications today.  Please call if any questions or concerns.    YOU HAD AN ENDOSCOPIC PROCEDURE TODAY AT THE  ENDOSCOPY CENTER: Refer to the procedure report that was given to you for any specific questions about what was found during the examination.  If the procedure report does not answer your questions, please call your gastroenterologist to clarify.  If you requested that your care partner not be given the details of your procedure findings, then the procedure report has been included in a sealed envelope for you to review at your convenience later.  YOU SHOULD EXPECT: Some feelings of bloating in the abdomen. Passage of more gas than usual.  Walking can help get rid of the air that was put into your GI tract during the procedure and reduce the bloating. If you had a lower endoscopy (such as a colonoscopy or flexible sigmoidoscopy) you may notice spotting of blood in your stool or on the toilet paper. If you underwent a bowel prep for your procedure, then you may not have a normal bowel movement for a few days.  DIET: Your first meal following the procedure should be a light meal and then it is ok to progress to your normal diet.  A half-sandwich or bowl of soup is an example of a good first meal.  Heavy or fried foods are harder to digest and may make you feel nauseous or bloated.  Likewise meals heavy in dairy and vegetables can cause extra gas to form and this can also increase the bloating.  Drink plenty of fluids but you should avoid alcoholic beverages for 24 hours.  ACTIVITY: Your care partner should take you home directly after the procedure.  You should plan to take it easy, moving slowly for the rest of the day.  You can resume normal activity the day after the procedure however you should  NOT DRIVE or use heavy machinery for 24 hours (because of the sedation medicines used during the test).    SYMPTOMS TO REPORT IMMEDIATELY: A gastroenterologist can be reached at any hour.  During normal business hours, 8:30 AM to 5:00 PM Monday through Friday, call [phone].  After hours and on weekends, please call the GI answering service at [phone] who will take a message and have the physician on call contact you.   Following lower endoscopy (colonoscopy or flexible sigmoidoscopy):  Excessive amounts of blood in the stool  Significant tenderness or worsening of abdominal pains  Swelling of the abdomen that is new, acute  Fever of 100F or higher  Following upper endoscopy (EGD)  Vomiting of blood or coffee ground material  New chest pain or pain under the shoulder blades  Painful or persistently difficult swallowing  New shortness of breath  Fever of 100F or higher  Black, tarry-looking stools  FOLLOW UP: If any biopsies were taken you will be contacted by phone or by letter within the next 1-3 weeks.  Call your gastroenterologist if you have not heard about the biopsies in 3 weeks.  Our staff will call the home number listed on your records the next business day following your procedure to check on you and address any questions or concerns that you may have at that time regarding the information given to you following your  procedure. This is a courtesy call and so if there is no answer at the home number and we have not heard from you through the emergency physician on call, we will assume that you have returned to your regular daily activities without incident.  SIGNATURES/CONFIDENTIALITY: You and/or your care partner have signed paperwork which will be entered into your electronic medical record.  These signatures attest to the fact that that the information above on your After Visit Summary has been reviewed and is understood.  Full responsibility of the confidentiality  of this discharge information lies with you and/or your care-partner.

## 2012-07-13 NOTE — Op Note (Signed)
[facility] [address] [place], [address]   COLONOSCOPY PROCEDURE REPORT  PATIENT: Ronald, Mcintosh  MR#: [id number] BIRTHDATE: [birth year] , 46  yrs. old GENDER: Male ENDOSCOPIST: Mardella Layman, MD, [facility] REFERRED BY: PROCEDURE DATE:  07/13/2012 PROCEDURE:   Colonoscopy, screening ASA CLASS:   Class II INDICATIONS:patient's immediate family history of colon cancer. MEDICATIONS: propofol (Diprivan) 150mg  IV  DESCRIPTION OF PROCEDURE:   After the risks and benefits and of the procedure were explained, informed consent was obtained.  A digital rectal exam revealed no abnormalities of the rectum.    The LB CF-[id number]  endoscope was introduced through the anus and advanced to the terminal ileum which was intubated for a short distance .  The quality of the prep was excellent, using MoviPrep . The instrument was then slowly withdrawn as the colon was fully examined.     COLON FINDINGS: A normal appearing cecum, ileocecal valve, and appendiceal orifice were identified.  The ascending, hepatic flexure, transverse, splenic flexure, descending, sigmoid colon and rectum appeared unremarkable.  No polyps or cancers were seen. Mild diverticulosis was noted in the sigmoid colon.     Retroflexed views revealed no abnormalities.     The scope was then withdrawn from the patient and the procedure completed.  COMPLICATIONS: There were no complications. ENDOSCOPIC IMPRESSION: 1.   Normal colon 2.   Mild diverticulosis was noted in the sigmoid colon  RECOMMENDATIONS: 1.  High fiber diet 2.   repeat exam 5 years   REPEAT EXAM:  cc:    regina York,FNP  _______________________________ eSignedMardella Layman, MD, [facility] 07/13/2012 9:28 AM

## 2012-07-13 NOTE — Progress Notes (Signed)
Patient ID: Ronald Mcintosh, male   DOB: Jul 09, 1966, 46 y.o.   MRN: [id number] VSS-No anesthesia complication noted-Report to PACU-Uneventful

## 2012-07-13 NOTE — Op Note (Signed)
 [facility] [address] [place], [address]   ENDOSCOPY PROCEDURE REPORT  PATIENT: Ronald Mcintosh, Ronald Mcintosh  MR#: [id number] BIRTHDATE: July 24, 1966 , 46  yrs. old GENDER: Male ENDOSCOPIST:David Hale Bogus, MD, [facility] REFERRED BY: Mauricio Po PROCEDURE DATE:  07/13/2012 PROCEDURE:   EGD w/ biopsy for H.pylori ASA CLASS:    Class II INDICATIONS: history of GERD. MEDICATION: There was residual sedation effect present from prior procedure and propofol (Diprivan) 150mg  IV TOPICAL ANESTHETIC:  DESCRIPTION OF PROCEDURE:   After the risks and benefits of the procedure were explained, informed consent was obtained.  The LB GIF-H180 [id number]  endoscope was introduced through the mouth  and advanced to the second portion of the duodenum .  The instrument was slowly withdrawn as the mucosa was fully examined.    The upper, middle and distal third of the esophagus were carefully inspected and no abnormalities were noted.  The z-line was well seen at the GEJ.  The endoscope was pushed into the fundus which was normal including a retroflexed view.  The antrum, gastric body, first and second part of the duodenum were unremarkable.Marland KitchenCLO bx. done.    Retroflexed views revealed a small hiatal hernia.    The scope was then withdrawn from the patient and the procedure completed.  COMPLICATIONS: There were no complications.   ENDOSCOPIC IMPRESSION: Normal EGD,small Hiatial hernia,no Barrett's mucosa  RECOMMENDATIONS: continue PPI    _______________________________ eSigned:  Mardella Layman, MD, [facility] 07/13/2012 9:33 AM   antireflux

## 2012-07-13 NOTE — Progress Notes (Signed)
Patient did not experience any of the following events: a burn prior to discharge; a fall within the facility; wrong site/side/patient/procedure/implant event; or a hospital transfer or hospital admission upon discharge from the facility. (G8907) Patient did not have preoperative order for IV antibiotic SSI prophylaxis. (G8918)  

## 2012-07-14 ENCOUNTER — Telehealth: Payer: Self-pay

## 2012-07-14 NOTE — Telephone Encounter (Signed)
Left message on answering machine. 

## 2012-07-15 ENCOUNTER — Encounter: Payer: Self-pay | Admitting: Gastroenterology

## 2012-07-15 LAB — HELICOBACTER PYLORI SCREEN-BIOPSY: UREASE: NEGATIVE

## 2012-07-20 ENCOUNTER — Telehealth: Payer: Self-pay | Admitting: Gastroenterology

## 2012-07-20 NOTE — Telephone Encounter (Signed)
Pt stated he originally asked for med to be sent to [facility] and the script is not ready. I called is preferred Pharmacy, [facility] and they have the script ready; pt stated understanding.

## 2012-10-19 ENCOUNTER — Telehealth: Payer: Self-pay | Admitting: *Deleted

## 2012-10-19 DIAGNOSIS — K219 Gastro-esophageal reflux disease without esophagitis: Secondary | ICD-10-CM

## 2012-10-19 MED ORDER — DEXLANSOPRAZOLE 60 MG PO CPDR: 60.0000 mg | DELAYED_RELEASE_CAPSULE | Freq: Every day | ORAL | Status: DC

## 2012-10-19 NOTE — Telephone Encounter (Signed)
Yes qam

## 2012-10-19 NOTE — Telephone Encounter (Signed)
Prescription was sent in September for Dexilant to be taken twice a day.  Patient has been getting samples and taking Dexilant once a day.   Dexilant twice a day is not covered by the insurance is it ok for patient to take Dexilant once a day? Please advise

## 2012-10-20 ENCOUNTER — Telehealth: Payer: Self-pay | Admitting: *Deleted

## 2012-10-20 NOTE — Telephone Encounter (Signed)
Received prior authorization for [facility] [phone]  I spoke with Victorino Dike and Victorino Dike said I need to call [phone] for the [facility] number  I spoke with Verna Czech and per Verna Czech she will fax form over for prior authorization. I will need to fill form out and fax back  Prior authorization will take 24-72 hrs for approval.

## 2013-07-06 ENCOUNTER — Encounter (INDEPENDENT_AMBULATORY_CARE_PROVIDER_SITE_OTHER): Payer: Self-pay

## 2013-07-06 ENCOUNTER — Ambulatory Visit (INDEPENDENT_AMBULATORY_CARE_PROVIDER_SITE_OTHER): Payer: BC Managed Care – PPO

## 2013-07-06 VITALS — BP 121/73 | HR 74 | Resp 20 | Ht 67.0 in | Wt 210.0 lb

## 2013-07-06 DIAGNOSIS — M722 Plantar fascial fibromatosis: Secondary | ICD-10-CM

## 2013-07-06 DIAGNOSIS — B351 Tinea unguium: Secondary | ICD-10-CM

## 2013-07-06 DIAGNOSIS — M79609 Pain in unspecified limb: Secondary | ICD-10-CM

## 2013-07-06 NOTE — Progress Notes (Signed)
  Subjective:    Patient ID: Ronald Mcintosh, male    DOB: October 28, 1965, 47 y.o.   MRN: [id number] "I'm here to get orthotics made.  He treated me for arch pain before.  I got something going on with my toenails." Patient has thick brittle yellow discolored nails 1 through 4 bilateral his previous he tried using topical with little success had to stop utilizing oral medications Sporanox the past due to GI ulceration therefore not a candidate for oral antifungal medications at this time. Patient had questions about the laser treatment however after discussing and being advised is not covered by insurance. Patient is it shouldn't utilizing alternative topical such as formula 3 which we previously discussed.  Patient continues to have plantar fascial symptomology which is benefit from orthotics. His current orthotics are worn a knee replacement at this time. HPI Comments: N  Thick , discolored L  Fungal nails b/l D 10 years O gradually C gotten worse A  no T  Oral medicine he prescribed that caused bleeding ulcer      Review of Systems  Constitutional: Negative.   HENT: Negative.   Eyes: Negative.   Respiratory: Negative.   Cardiovascular: Negative.   Gastrointestinal: Negative.   Endocrine: Negative.   Genitourinary: Negative.   Musculoskeletal: Negative.   Skin: Negative.   Allergic/Immunologic: Positive for environmental allergies.  Neurological: Negative.   Hematological: Negative.   Psychiatric/Behavioral: Negative.        Objective:   Physical Exam Neurovascular status is intact with pedal pulses palpable DP postal for PT posterior were for Refill timed 3-4 seconds. Skin temperature warm turgor normal. No edema rubor pallor or varicosities noted. Epicritic and proprioceptive sensations intact and symmetric bilateral. Normal plantar response and DTRs. Dermatologically skin color pigment normal hair growth present. Nails thick brittle and friable with yellowing discoloration  distally. Consistent with distal subungual onychomycosis. Patient has tried different treatments in the past with recurrence. On examination the arch this time there some nodularity Magan plantar fascia left and right arch current orthotics are worn and breaking down. May need refurbishing with operating in the future. Patient requesting you orthoses at this time as orthotics have been beneficial in managing his symptoms of plantar fasciitis. No other changes in foot structure or new complaints.       Assessment & Plan:  #1 onychomycosis/mycotic nails 1 through 5 bilateral Plan is for use a topical antifungal dispensed formula 3: Applied twice daily to the affected nails as instructed for 12 months duration minimum  #2 plantar fasciitis/heel spur syndrome Casting for new functional orthoses at this time. You felt orthotic with been cutout covering extrinsic rear foot posting. Patient be contacted when orthotics ready for fitting and dispensing sometime within the next 3 or 4 weeks. Plan for possible re\re for visual orthotics to maintain as a backup side. Suggested a 3-6 month followup for management of plantar fasciitis and onychomycosis in the future.  Alvan Dame DPM

## 2013-07-06 NOTE — Patient Instructions (Signed)
Onychomycosis/Fungal Toenails  WHAT IS IT? An infection that lies within the keratin of your nail plate that is caused by a fungus.  WHY ME? Fungal infections affect all ages, sexes, races, and creeds.  There may be many factors that predispose you to a fungal infection such as age, coexisting medical conditions such as diabetes, or an autoimmune disease; stress, medications, fatigue, genetics, etc.  Bottom line: fungus thrives in a warm, moist environment and your shoes offer such a location.  IS IT CONTAGIOUS? Theoretically, yes.  You do not want to share shoes, nail clippers or files with someone who has fungal toenails.  Walking around barefoot in the same room or sleeping in the same bed is unlikely to transfer the organism.  It is important to realize, however, that fungus can spread easily from one nail to the next on the same foot.  HOW DO WE TREAT THIS?  There are several ways to treat this condition.  Treatment may depend on many factors such as age, medications, pregnancy, liver and kidney conditions, etc.  It is best to ask your doctor which options are available to you.  1. No treatment.   Unlike many other medical concerns, you can live with this condition.  However for many people this can be a painful condition and may lead to ingrown toenails or a bacterial infection.  It is recommended that you keep the nails cut short to help reduce the amount of fungal nail. 2. Topical treatment.  These range from herbal remedies to prescription strength nail lacquers.  About 40-50% effective, topicals require twice daily application for approximately 9 to 12 months or until an entirely new nail has grown out.  The most effective topicals are medical grade medications available through physicians offices. 3. Oral antifungal medications.  With an 80-90% cure rate, the most common oral medication requires 3 to 4 months of therapy and stays in your system for a year as the new nail grows out.  Oral  antifungal medications do require blood work to make sure it is a safe drug for you.  A liver function panel will be performed prior to starting the medication and after the first month of treatment.  It is important to have the blood work performed to avoid any harmful side effects.  In general, this medication safe but blood work is required. 4. Laser Therapy.  This treatment is performed by applying a specialized laser to the affected nail plate.  This therapy is noninvasive, fast, and non-painful.  It is not covered by insurance and is therefore, out of pocket.  The results have been very good with a 80-95% cure rate.  The [facility] is the only practice in the area to offer this therapy. 5. Permanent Nail Avulsion.  Removing the entire nail so that a new nail will not grow back. 6. Today's recommendation for treatment is topical antifungal formula 3. Apply formula 3 twice daily to the affected nails for 12 months duration.

## 2013-07-13 NOTE — Addendum Note (Signed)
Addended by: Hadley Pen R on: 07/13/2013 07:51 AM   Modules accepted: Orders

## 2014-02-06 DIAGNOSIS — B350 Tinea barbae and tinea capitis: Secondary | ICD-10-CM

## 2014-02-13 DIAGNOSIS — N529 Male erectile dysfunction, unspecified: Secondary | ICD-10-CM | POA: Insufficient documentation

## 2014-02-13 DIAGNOSIS — N419 Inflammatory disease of prostate, unspecified: Secondary | ICD-10-CM | POA: Insufficient documentation

## 2014-02-13 DIAGNOSIS — E291 Testicular hypofunction: Secondary | ICD-10-CM | POA: Insufficient documentation

## 2014-02-13 DIAGNOSIS — R300 Dysuria: Secondary | ICD-10-CM | POA: Insufficient documentation

## 2014-02-13 DIAGNOSIS — N5201 Erectile dysfunction due to arterial insufficiency: Secondary | ICD-10-CM | POA: Insufficient documentation

## 2014-02-23 DIAGNOSIS — R3911 Hesitancy of micturition: Secondary | ICD-10-CM | POA: Insufficient documentation

## 2014-02-23 DIAGNOSIS — Z86711 Personal history of pulmonary embolism: Secondary | ICD-10-CM | POA: Insufficient documentation

## 2014-02-23 DIAGNOSIS — I2699 Other pulmonary embolism without acute cor pulmonale: Secondary | ICD-10-CM | POA: Insufficient documentation

## 2014-02-23 DIAGNOSIS — R6882 Decreased libido: Secondary | ICD-10-CM | POA: Insufficient documentation

## 2014-02-23 DIAGNOSIS — R350 Frequency of micturition: Secondary | ICD-10-CM | POA: Insufficient documentation

## 2014-02-23 DIAGNOSIS — R1084 Generalized abdominal pain: Secondary | ICD-10-CM | POA: Insufficient documentation

## 2014-06-04 ENCOUNTER — Encounter: Payer: Self-pay | Admitting: Gastroenterology

## 2014-08-04 ENCOUNTER — Emergency Department (HOSPITAL_COMMUNITY)
Admission: EM | Admit: 2014-08-04 | Discharge: 2014-08-04 | Disposition: A | Discharge status: Home / Self Care | Payer: BC Managed Care – PPO | Attending: Emergency Medicine | Admitting: Emergency Medicine

## 2014-08-04 ENCOUNTER — Emergency Department (HOSPITAL_COMMUNITY): Payer: BC Managed Care – PPO

## 2014-08-04 ENCOUNTER — Encounter (HOSPITAL_COMMUNITY): Payer: Self-pay | Admitting: *Deleted

## 2014-08-04 DIAGNOSIS — K219 Gastro-esophageal reflux disease without esophagitis: Secondary | ICD-10-CM | POA: Insufficient documentation

## 2014-08-04 DIAGNOSIS — F419 Anxiety disorder, unspecified: Secondary | ICD-10-CM | POA: Insufficient documentation

## 2014-08-04 DIAGNOSIS — Z79899 Other long term (current) drug therapy: Secondary | ICD-10-CM | POA: Insufficient documentation

## 2014-08-04 DIAGNOSIS — K5792 Diverticulitis of intestine, part unspecified, without perforation or abscess without bleeding: Secondary | ICD-10-CM | POA: Diagnosis not present

## 2014-08-04 DIAGNOSIS — R109 Unspecified abdominal pain: Secondary | ICD-10-CM

## 2014-08-04 DIAGNOSIS — Z87891 Personal history of nicotine dependence: Secondary | ICD-10-CM | POA: Insufficient documentation

## 2014-08-04 DIAGNOSIS — R1032 Left lower quadrant pain: Secondary | ICD-10-CM | POA: Diagnosis present

## 2014-08-04 LAB — COMPREHENSIVE METABOLIC PANEL
ALT: 61 U/L — ABNORMAL HIGH (ref 0–53)
ANION GAP: 12 (ref 5–15)
AST: 33 U/L (ref 0–37)
Albumin: 3.9 g/dL (ref 3.5–5.2)
Alkaline Phosphatase: 56 U/L (ref 39–117)
BUN: 14 mg/dL (ref 6–23)
CO2: 27 mEq/L (ref 19–32)
CREATININE: 1.69 mg/dL — AB (ref 0.50–1.35)
Calcium: 9.3 mg/dL (ref 8.4–10.5)
Chloride: 107 mEq/L (ref 96–112)
GFR calc Af Amer: 53 mL/min — ABNORMAL LOW (ref >90)
GFR calc non Af Amer: 46 mL/min — ABNORMAL LOW (ref >90)
Glucose, Bld: 120 mg/dL — ABNORMAL HIGH (ref 70–99)
Potassium: 4.3 mEq/L (ref 3.7–5.3)
Sodium: 146 mEq/L (ref 137–147)
TOTAL PROTEIN: 7.6 g/dL (ref 6.0–8.3)
Total Bilirubin: 0.6 mg/dL (ref 0.3–1.2)

## 2014-08-04 LAB — CBC WITH DIFFERENTIAL/PLATELET
BASOS ABS: 0 10*3/uL (ref 0.0–0.1)
Basophils Relative: 0 % (ref 0–1)
EOS ABS: 0.1 10*3/uL (ref 0.0–0.7)
Eosinophils Relative: 1 % (ref 0–5)
HCT: 48.8 % (ref 39.0–52.0)
HEMOGLOBIN: 16.8 g/dL (ref 13.0–17.0)
Lymphocytes Relative: 13 % (ref 12–46)
Lymphs Abs: 1.5 10*3/uL (ref 0.7–4.0)
MCH: 28.6 pg (ref 26.0–34.0)
MCHC: 34.4 g/dL (ref 30.0–36.0)
MCV: 83 fL (ref 78.0–100.0)
MONOS PCT: 10 % (ref 3–12)
Monocytes Absolute: 1.2 10*3/uL — ABNORMAL HIGH (ref 0.1–1.0)
NEUTROS PCT: 76 % (ref 43–77)
Neutro Abs: 8.8 10*3/uL — ABNORMAL HIGH (ref 1.7–7.7)
PLATELETS: 189 10*3/uL (ref 150–400)
RBC: 5.88 MIL/uL — ABNORMAL HIGH (ref 4.22–5.81)
RDW: 13.6 % (ref 11.5–15.5)
WBC: 11.7 10*3/uL — ABNORMAL HIGH (ref 4.0–10.5)

## 2014-08-04 LAB — LIPASE, BLOOD: LIPASE: 33 U/L (ref 11–59)

## 2014-08-04 MED ORDER — METRONIDAZOLE 500 MG PO TABS: 500.0000 mg | ORAL_TABLET | Freq: Two times a day (BID) | ORAL | Status: DC

## 2014-08-04 MED ORDER — LEVOFLOXACIN 500 MG PO TABS
750.0000 mg | ORAL_TABLET | Freq: Once | ORAL | Status: AC
  Administered 2014-08-04: 750 mg via ORAL
  Filled 2014-08-04: qty 2

## 2014-08-04 MED ORDER — GLYCOPYRROLATE 0.2 MG/ML IJ SOLN
0.1000 mg | Freq: Once | INTRAMUSCULAR | Status: AC
  Administered 2014-08-04: 0.1 mg via INTRAVENOUS
  Filled 2014-08-04: qty 1

## 2014-08-04 MED ORDER — IOHEXOL 300 MG/ML  SOLN
50.0000 mL | Freq: Once | INTRAMUSCULAR | Status: AC | PRN
  Administered 2014-08-04: 50 mL via ORAL

## 2014-08-04 MED ORDER — METRONIDAZOLE 500 MG PO TABS
500.0000 mg | ORAL_TABLET | Freq: Once | ORAL | Status: AC
  Administered 2014-08-04: 500 mg via ORAL
  Filled 2014-08-04: qty 1

## 2014-08-04 MED ORDER — HYDROCODONE-ACETAMINOPHEN 5-325 MG PO TABS
1.0000 | ORAL_TABLET | Freq: Once | ORAL | Status: AC
  Administered 2014-08-04: 1 via ORAL
  Filled 2014-08-04: qty 1

## 2014-08-04 MED ORDER — HYDROCODONE-ACETAMINOPHEN 5-325 MG PO TABS: 1.0000 | ORAL_TABLET | ORAL | Status: DC | PRN

## 2014-08-04 MED ORDER — CIPROFLOXACIN HCL 500 MG PO TABS: 500.0000 mg | ORAL_TABLET | Freq: Two times a day (BID) | ORAL | Status: DC

## 2014-08-04 MED ORDER — DOCUSATE SODIUM 100 MG PO CAPS: 100.0000 mg | ORAL_CAPSULE | Freq: Two times a day (BID) | ORAL | Status: DC

## 2014-08-04 MED ORDER — METRONIDAZOLE IN NACL 5-0.79 MG/ML-% IV SOLN: 500.0000 mg | Freq: Once | INTRAVENOUS | Status: DC

## 2014-08-04 MED ORDER — SODIUM CHLORIDE 0.9 % IV BOLUS (SEPSIS)
1000.0000 mL | Freq: Once | INTRAVENOUS | Status: AC
  Administered 2014-08-04: 1000 mL via INTRAVENOUS

## 2014-08-04 NOTE — ED Notes (Signed)
Patient had BM consisting of Ronald Mcintosh loose stool.

## 2014-08-04 NOTE — ED Provider Notes (Signed)
CSN: [id number]     Arrival date & time 08/04/14  1420 History   First MD Initiated Contact with Patient 08/04/14 1504     Chief Complaint  Patient presents with  . Abdominal Pain      HPI  Patient persist evaluation of left lower quadrant pain. Started on Wednesday evening. Crampy and intermittent. He fell he may been constipated, this is been a problem for him in the past. He is to laxative. He had bowel movements but persists with left lower quadrant and suprapubic pain. No blood in the stool. No fevers. Still for good appetite without nausea or vomiting. Has had previous colonoscopy and has known diverticular disease. His never had diverticulitis. He has no urinary symptoms. No back or flank pain. Past Medical History  Diagnosis Date  . Hiatal hernia 2007    egd  . Unspecified gastritis and gastroduodenitis without mention of hemorrhage 2007    egd  . GERD (gastroesophageal reflux disease)   . Family history of colon cancer     Mother   . Anxiety   . Allergy   . Ulcer    Past Surgical History  Procedure Laterality Date  . Elbow surgery    . Wisdom tooth extraction     Family History  Problem Relation Age of Onset  . Colon cancer Mother 32  . Cancer Mother    History  Substance Use Topics  . Smoking status: Former Research scientist (life sciences)  . Smokeless tobacco: Never Used  . Alcohol Use: No     Comment: Stopped drinking 21 yrs ago     Review of Systems  Constitutional: Negative for fever, chills, diaphoresis, appetite change and fatigue.  HENT: Negative for mouth sores, sore throat and trouble swallowing.   Eyes: Negative for visual disturbance.  Respiratory: Negative for cough, chest tightness, shortness of breath and wheezing.   Cardiovascular: Negative for chest pain.  Gastrointestinal: Positive for abdominal pain, diarrhea and constipation. Negative for nausea, vomiting and abdominal distention.  Endocrine: Negative for polydipsia, polyphagia and polyuria.  Genitourinary:  Negative for dysuria, frequency and hematuria.  Musculoskeletal: Negative for gait problem.  Skin: Negative for color change, pallor and rash.  Neurological: Negative for dizziness, syncope, light-headedness and headaches.  Hematological: Does not bruise/bleed easily.  Psychiatric/Behavioral: Negative for behavioral problems and confusion.      Allergies  Review of patient's allergies indicates no known allergies.  Home Medications   Prior to Admission medications   Medication Sig Start Date End Date Taking? Authorizing Provider  buPROPion (WELLBUTRIN XL) 300 MG 24 hr tablet Take 300 mg by mouth daily. Takes one tablet by mouth once daily 05/21/12  Yes Historical Provider, MD  cetirizine (ZYRTEC) 10 MG tablet Take 10 mg by mouth daily.   Yes Historical Provider, MD  clonazePAM (KLONOPIN) 0.5 MG tablet Take 0.5 mg by mouth as needed for anxiety.    Yes Historical Provider, MD  pantoprazole (PROTONIX) 40 MG tablet Take 40 mg by mouth daily.   Yes Historical Provider, MD  Tamsulosin HCl (FLOMAX) 0.4 MG CAPS Take 0.4 mg by mouth daily after breakfast. Takes as directed 05/09/12  Yes Historical Provider, MD  TESTIM 50 MG/5GM GEL Place 5 g onto the skin daily. Takes as directed 05/21/12  Yes Historical Provider, MD  ciprofloxacin (CIPRO) 500 MG tablet Take 1 tablet (500 mg total) by mouth every 12 (twelve) hours. 08/04/14   Tanna Furry, MD  docusate sodium (COLACE) 100 MG capsule Take 1 capsule (100 mg total) by  mouth every 12 (twelve) hours. 08/04/14   Tanna Furry, MD  HYDROcodone-acetaminophen (NORCO/VICODIN) 5-325 MG per tablet Take 1 tablet by mouth every 4 (four) hours as needed. 08/04/14   Tanna Furry, MD  metroNIDAZOLE (FLAGYL) 500 MG tablet Take 1 tablet (500 mg total) by mouth 2 (two) times daily. 08/04/14   Tanna Furry, MD   BP 128/65 mmHg  Pulse 91  Temp(Src) 97.5 F (36.4 C)  Resp 16  SpO2 94% Physical Exam  Constitutional: He is oriented to person, place, and time. He appears  well-developed and well-nourished. No distress.  HENT:  Head: Normocephalic.  Eyes: Conjunctivae are normal. Pupils are equal, round, and reactive to light. No scleral icterus.  Neck: Normal range of motion. Neck supple. No thyromegaly present.  Cardiovascular: Normal rate and regular rhythm.  Exam reveals no gallop and no friction rub.   No murmur heard. Pulmonary/Chest: Effort normal and breath sounds normal. No respiratory distress. He has no wheezes. He has no rales.  Abdominal: Soft. Bowel sounds are normal. He exhibits no distension. There is no tenderness. There is no rebound.    Musculoskeletal: Normal range of motion.  Neurological: He is alert and oriented to person, place, and time.  Skin: Skin is warm and dry. No rash noted.  Psychiatric: He has a normal mood and affect. His behavior is normal.    ED Course  Procedures (including critical care time) Labs Review Labs Reviewed  CBC WITH DIFFERENTIAL - Abnormal; Notable for the following:    WBC 11.7 (*)    RBC 5.88 (*)    Neutro Abs 8.8 (*)    Monocytes Absolute 1.2 (*)    All other components within normal limits  COMPREHENSIVE METABOLIC PANEL - Abnormal; Notable for the following:    Glucose, Bld 120 (*)    Creatinine, Ser 1.69 (*)    ALT 61 (*)    GFR calc non Af Amer 46 (*)    GFR calc Af Amer 53 (*)    All other components within normal limits  LIPASE, BLOOD    Imaging Review Ct Abdomen Pelvis Wo Contrast  08/04/2014   CLINICAL DATA:  Left lower quadrant pain.  Acute onset.  EXAM: CT ABDOMEN AND PELVIS WITHOUT CONTRAST  TECHNIQUE: Multidetector CT imaging of the abdomen and pelvis was performed following the standard protocol without IV contrast.  COMPARISON:  CT 07/27/2003  FINDINGS: Lower chest:  Lung bases are clear.  No pericardial fluid.  Hepatobiliary: Low-attenuation liver consistent with hepatic steatosis. Small gallstones in the gallbladder. No gallbladder inflammation.  Pancreas: Pancreas is normal. No  ductal dilatation. No pancreatic inflammation.  Spleen: Normal spleen.  Adrenals/urinary tract: Adrenal glands are normal. Kidneys and ureters are normal. Bladder is normal.  Stomach/Bowel: Stomach, small bowel, cecum are normal. There is a 5 cm segment of bowel wall thickening and pericolonic inflammation involving the proximal sigmoid colon (image 85, series 2). There are several diverticula this region. Findings are consistent with acute diverticulitis. There is no evidence of macroperforation or abscess. Rectum is normal.  Vascular/Lymphatic: Abdominal aorta is normal caliber. There is no retroperitoneal or periportal lymphadenopathy. No pelvic lymphadenopathy.  Reproductive: Prostate gland is normal. No pelvic lymphadenopathy. No free fluid the pelvis.  Musculoskeletal: No aggressive osseous lesion  IMPRESSION: 1. Acute non complicated diverticulitis of the proximal sigmoid colon. 2. Hepatic steatosis 3. Small gallstone without evidence of cholecystitis.   Electronically Signed   By: Suzy Bouchard M.D.   On: 08/04/2014 16:53  EKG Interpretation None      MDM   Final diagnoses:  Abdominal pain  Acute diverticulitis    Labs show mild leukocytosis 11.7. No hemoglobin. Creatinine slightly above his baseline of 1.69. BUN 14. Lipase 33. CT scan shows a couple located sigmoid diverticulitis. No perforation or abscess. He declined pain medicine initially. He began to complain of some cramping. Given IV Robinul is symptom-free. His abdomen remains benign. Think is appropriate for outpatient treatment. Given first dose of Cipro, and Flagyl by mouth. History home with Cipro, Flagyl, Vicodin and prescriptions. He has primary care physician that he follows closely with. As can be reevaluated any failure to improve in the ER with any acute changes.    Tanna Furry, MD 08/04/14 [phone]

## 2014-08-04 NOTE — ED Notes (Signed)
Pt reports left lower abdominal pain since last night, sts hx of diverticulosis, also reports constipation took mag citrate last night without relief.

## 2014-08-04 NOTE — Discharge Instructions (Signed)

## 2014-08-04 NOTE — ED Notes (Signed)
Patient c/o LLQ pain which began Wednesday past and has gotten progressively worse.  Patient thought he was constipated and took an enema and Mag Citrate yesterday.  Patient had a watery bowel movement with very little substance.  No melena or bright red blood noted.  Patient denies N/V/D, fever and SOB.  On exam, patient's abdomen is distended and painful to palpation >LLQ.  Patient has bowel sounds - hyperactive epigastric area and present RLQ, diminished LLQ.  Patient has good popliteal, dorsalis pedis and radial pulses.

## 2015-03-17 IMAGING — CT CT ABD-PELV W/O CM
1 of 2 series · 15 of 32 positions shown, 19 images · non-contrast
Comparison: CT 07/27/2003

CLINICAL DATA: Left lower quadrant pain.  Acute onset.

EXAM:
CT ABDOMEN AND PELVIS WITHOUT CONTRAST
TECHNIQUE: Multidetector CT imaging of the abdomen and pelvis was performed
following the standard protocol without IV contrast.

[Series 2: abd/pel w/o · axial · non-contrast · 0.80mm/px · z∈[-705,-230]mm · 15 of 105 slices shown, 19 images]
[im 5/105  soft-tissue]
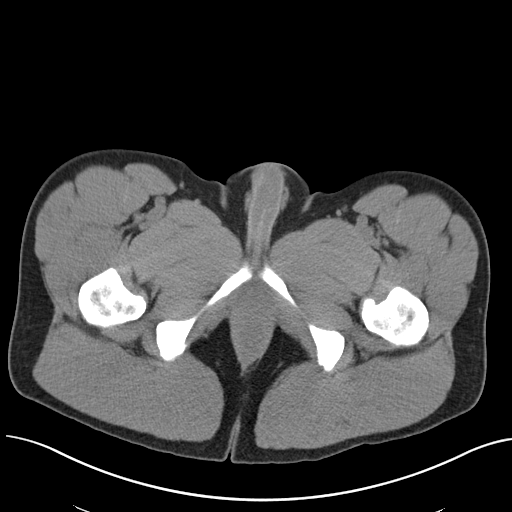
[im 5/105  bone]
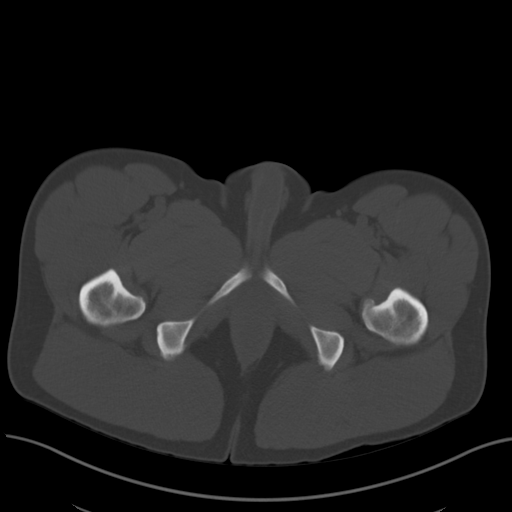
[im 14/105  soft-tissue]
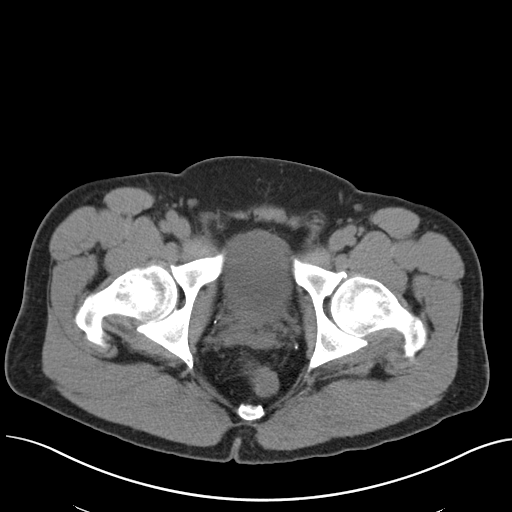
[im 22/105  soft-tissue]
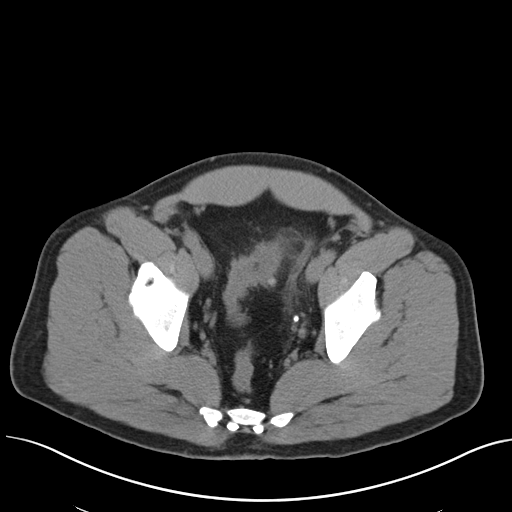
[im 31/105  soft-tissue]
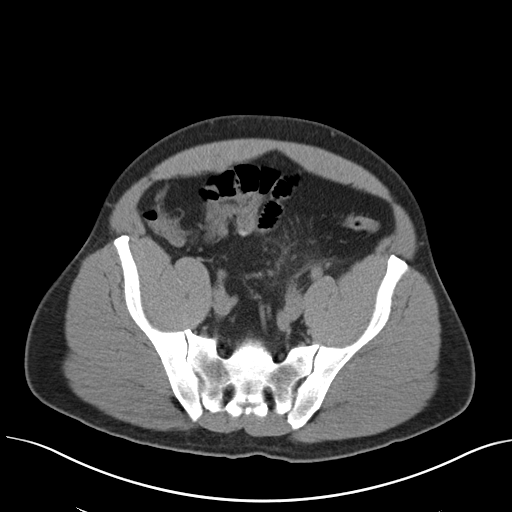
[im 35/105  soft-tissue]
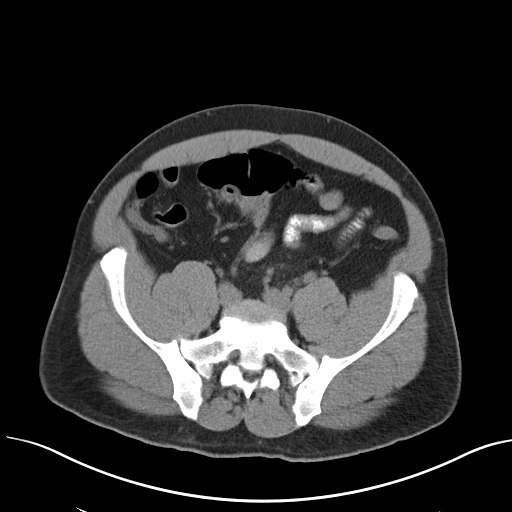
[im 44/105  soft-tissue]
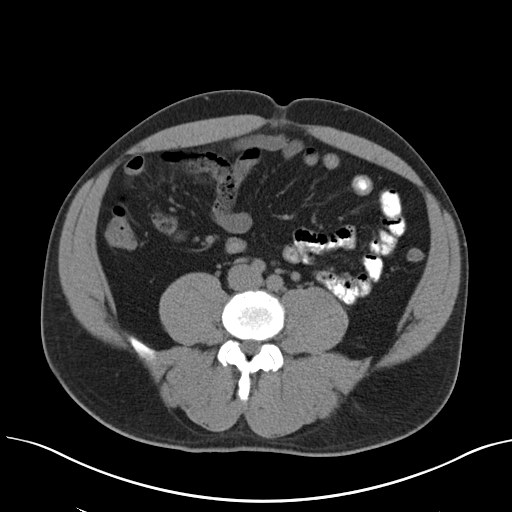
[im 53/105  soft-tissue]
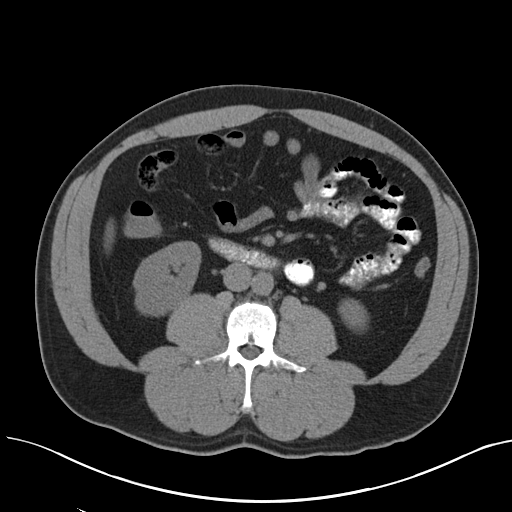
[im 61/105  soft-tissue]
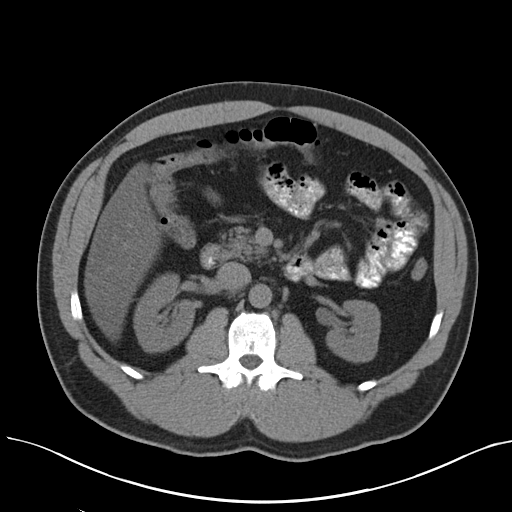
[im 70/105  soft-tissue]
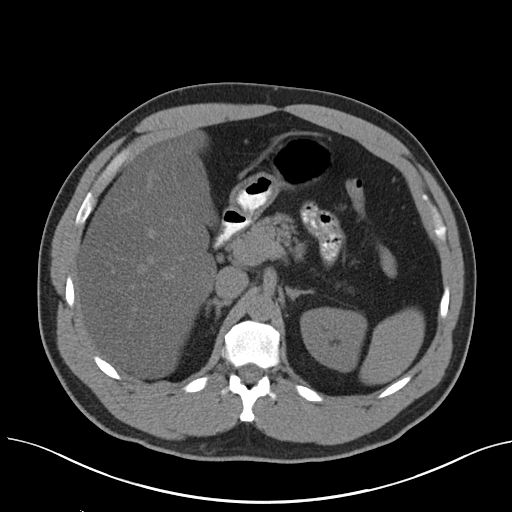
[im 70/105  bone]
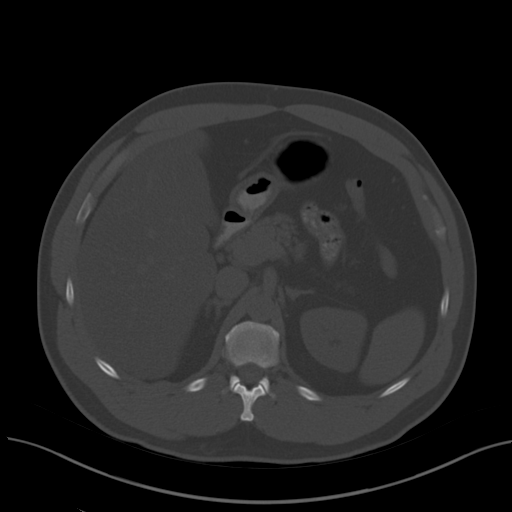
[im 74/105  soft-tissue]
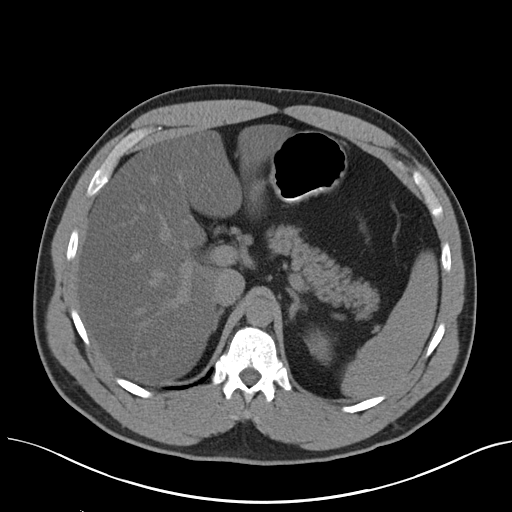
[im 83/105  soft-tissue]
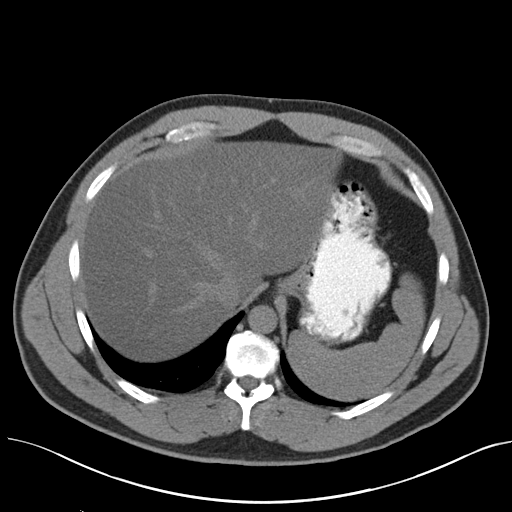
[im 87/105  lung]
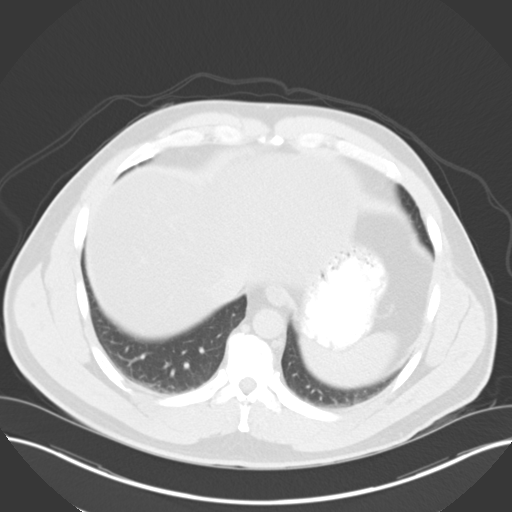
[im 92/105  soft-tissue]
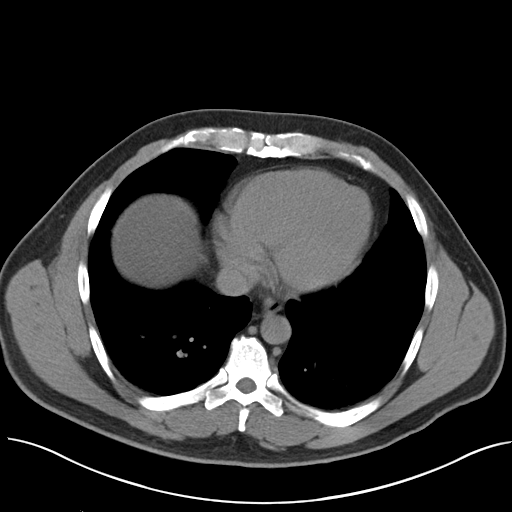
[im 92/105  lung]
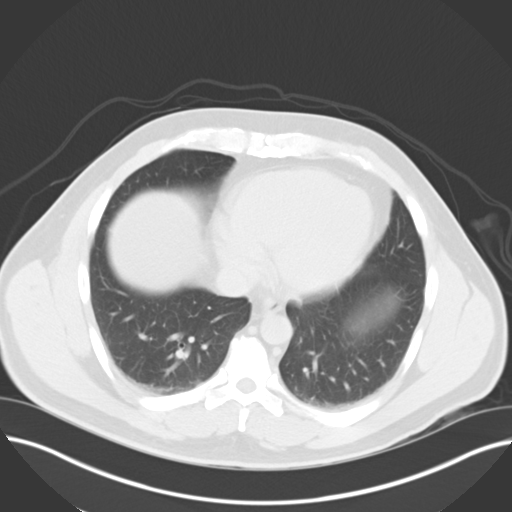
[im 96/105  lung]
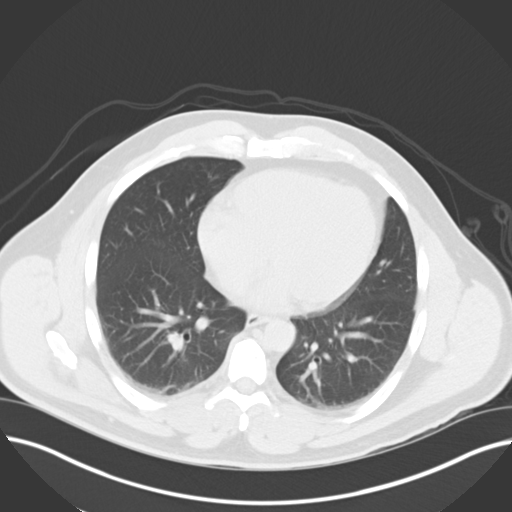
[im 100/105  soft-tissue]
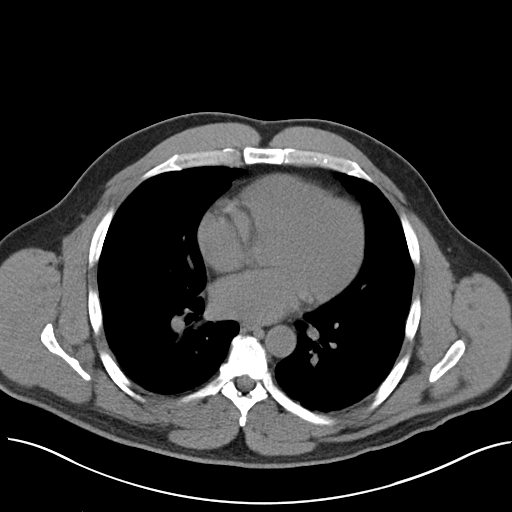
[im 100/105  lung]
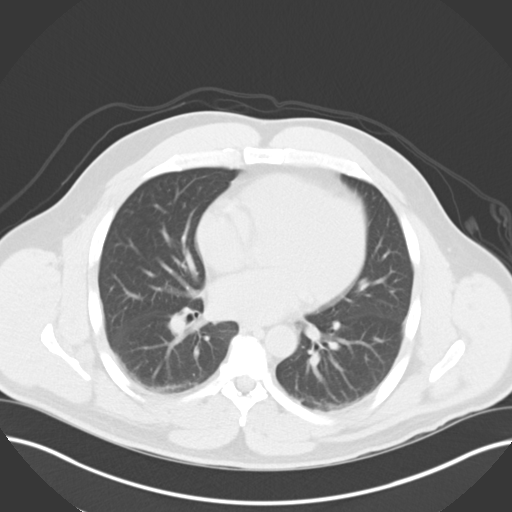

[15 of 32 positions shown; findings below may reference images not displayed]

FINDINGS: Lower chest:  Lung bases are clear.  No pericardial fluid.

Hepatobiliary: Low-attenuation liver consistent with hepatic
steatosis. Small gallstones in the gallbladder. No gallbladder
inflammation.

Pancreas: Pancreas is normal. No ductal dilatation. No pancreatic
inflammation.

Spleen: Normal spleen.

Adrenals/urinary tract: Adrenal glands are normal. Kidneys and
ureters are normal. Bladder is normal.

Stomach/Bowel: Stomach, small bowel, cecum are normal. There is a 5
cm segment of bowel wall thickening and pericolonic inflammation
involving the proximal sigmoid colon (image 85, series 2). There are
several diverticula this region. Findings are consistent with acute
diverticulitis. There is no evidence of macroperforation or abscess.
Rectum is normal.

Vascular/Lymphatic: Abdominal aorta is normal caliber. There is no
retroperitoneal or periportal lymphadenopathy. No pelvic
lymphadenopathy.

Reproductive: Prostate gland is normal. No pelvic lymphadenopathy.
No free fluid the pelvis.

Musculoskeletal: No aggressive osseous lesion
IMPRESSION: 1. Acute non complicated diverticulitis of the proximal sigmoid
colon.
2. Hepatic steatosis
3. Small gallstone without evidence of cholecystitis.

## 2015-12-19 ENCOUNTER — Telehealth: Payer: Self-pay | Admitting: Internal Medicine

## 2015-12-19 NOTE — Telephone Encounter (Signed)
Pt states that his stools are very black and tarry. Discussed with pt that he needs to go to the ER of urgent care to be evaluated. Pt verbalized understanding.

## 2015-12-25 ENCOUNTER — Encounter (HOSPITAL_COMMUNITY): Payer: Self-pay | Admitting: Emergency Medicine

## 2015-12-25 ENCOUNTER — Emergency Department (HOSPITAL_COMMUNITY)
Admission: EM | Admit: 2015-12-25 | Discharge: 2015-12-25 | Disposition: A | Discharge status: Home / Self Care | Payer: BLUE CROSS/BLUE SHIELD | Attending: Emergency Medicine | Admitting: Emergency Medicine

## 2015-12-25 DIAGNOSIS — K219 Gastro-esophageal reflux disease without esophagitis: Secondary | ICD-10-CM | POA: Insufficient documentation

## 2015-12-25 DIAGNOSIS — Z792 Long term (current) use of antibiotics: Secondary | ICD-10-CM | POA: Diagnosis not present

## 2015-12-25 DIAGNOSIS — R195 Other fecal abnormalities: Secondary | ICD-10-CM

## 2015-12-25 DIAGNOSIS — Z79899 Other long term (current) drug therapy: Secondary | ICD-10-CM | POA: Insufficient documentation

## 2015-12-25 DIAGNOSIS — Z872 Personal history of diseases of the skin and subcutaneous tissue: Secondary | ICD-10-CM | POA: Diagnosis not present

## 2015-12-25 DIAGNOSIS — F419 Anxiety disorder, unspecified: Secondary | ICD-10-CM | POA: Diagnosis not present

## 2015-12-25 DIAGNOSIS — Z7951 Long term (current) use of inhaled steroids: Secondary | ICD-10-CM | POA: Diagnosis not present

## 2015-12-25 DIAGNOSIS — K921 Melena: Secondary | ICD-10-CM | POA: Diagnosis not present

## 2015-12-25 DIAGNOSIS — Z87891 Personal history of nicotine dependence: Secondary | ICD-10-CM | POA: Insufficient documentation

## 2015-12-25 LAB — COMPREHENSIVE METABOLIC PANEL
ALK PHOS: 42 U/L (ref 38–126)
ALT: 41 U/L (ref 17–63)
AST: 29 U/L (ref 15–41)
Albumin: 4.2 g/dL (ref 3.5–5.0)
Anion gap: 6 (ref 5–15)
BILIRUBIN TOTAL: 0.4 mg/dL (ref 0.3–1.2)
BUN: 14 mg/dL (ref 6–20)
CALCIUM: 9.1 mg/dL (ref 8.9–10.3)
CO2: 27 mmol/L (ref 22–32)
Chloride: 107 mmol/L (ref 101–111)
Creatinine, Ser: 1.54 mg/dL — ABNORMAL HIGH (ref 0.61–1.24)
GFR, EST AFRICAN AMERICAN: 59 mL/min — AB (ref >60)
GFR, EST NON AFRICAN AMERICAN: 51 mL/min — AB (ref >60)
Glucose, Bld: 90 mg/dL (ref 65–99)
Potassium: 3.9 mmol/L (ref 3.5–5.1)
Sodium: 140 mmol/L (ref 135–145)
TOTAL PROTEIN: 6.9 g/dL (ref 6.5–8.1)

## 2015-12-25 LAB — POC OCCULT BLOOD, ED: FECAL OCCULT BLD: NEGATIVE

## 2015-12-25 LAB — TYPE AND SCREEN
ABO/RH(D): O POS
Antibody Screen: NEGATIVE

## 2015-12-25 LAB — ABO/RH: ABO/RH(D): O POS

## 2015-12-25 LAB — CBC
HCT: 38.2 % — ABNORMAL LOW (ref 39.0–52.0)
Hemoglobin: 11.8 g/dL — ABNORMAL LOW (ref 13.0–17.0)
MCH: 21.7 pg — ABNORMAL LOW (ref 26.0–34.0)
MCHC: 30.9 g/dL (ref 30.0–36.0)
MCV: 70.3 fL — ABNORMAL LOW (ref 78.0–100.0)
PLATELETS: 278 10*3/uL (ref 150–400)
RBC: 5.43 MIL/uL (ref 4.22–5.81)
RDW: 16.4 % — ABNORMAL HIGH (ref 11.5–15.5)
WBC: 7.4 10*3/uL (ref 4.0–10.5)

## 2015-12-25 NOTE — ED Notes (Signed)
Pt states he's had blood in his stools for a couple months, appointment with his GI doc in May, stools becoming dark, "tarry" and more frequent. Was told to come here to be prepped for endoscopy. Denies feeling weak, lightheaded and dizzy

## 2015-12-25 NOTE — ED Provider Notes (Signed)
CSN: [id number]     Arrival date & time 12/25/15  1527 History   First MD Initiated Contact with Patient 12/25/15 1642     Chief Complaint  Patient presents with  . Blood In Stools   HPI  Ronald Mcintosh is a 50 year old male with a past medical history of GERD, gastritis and anxiety presenting with melena. He reports dark-colored stools for the past 3-4 months. He reports visiting an urgent care when he first noticed dark stools and had a positive hemoccult card. He reports history of epigastric abdominal pain after eating that has been present for multiple years. He states he has changed his diet and drinks aloe vera gel which has improved his pain over the past few months. He is followed by [facility] and his next appointment is in 1 month. He states that he started an iron supplement last week and then noted darkening stool since. He states that he called [facility] who told him to come to ED for evaluation. Denies lightheadedness, dizziness, syncope, chest pain, SOB, abdominal pain, nausea, vomiting, hematochezia or any other related symptoms. He has no complaints at this time; states he is here "just to make sure nothing is getting worse since my appointment isn't for a month".   Chart review: Patient called GI office 6 days ago to report tarry stools and was instructed to come to ED for evaluation.   Past Medical History  Diagnosis Date  . Hiatal hernia 2007    egd  . Unspecified gastritis and gastroduodenitis without mention of hemorrhage 2007    egd  . GERD (gastroesophageal reflux disease)   . Family history of colon cancer     Mother   . Anxiety   . Allergy   . Ulcer    Past Surgical History  Procedure Laterality Date  . Elbow surgery    . Wisdom tooth extraction     Family History  Problem Relation Age of Onset  . Colon cancer Mother 13  . Cancer Mother    Social History  Substance Use Topics  . Smoking status: Former Research scientist (life sciences)  . Smokeless tobacco: Never Used  . Alcohol Use:  No     Comment: Stopped drinking 21 yrs ago     Review of Systems  All other systems reviewed and are negative.     Allergies  Review of patient's allergies indicates no known allergies.  Home Medications   Prior to Admission medications   Medication Sig Start Date End Date Taking? Authorizing Provider  buPROPion (WELLBUTRIN XL) 300 MG 24 hr tablet Take 300 mg by mouth daily.  05/21/12  Yes Historical Provider, MD  cetirizine (ZYRTEC) 10 MG tablet Take 10 mg by mouth at bedtime.    Yes Historical Provider, MD  clonazePAM (KLONOPIN) 0.5 MG tablet Take 0.5 mg by mouth daily as needed for anxiety.    Yes Historical Provider, MD  DYMISTA 137-50 MCG/ACT SUSP Place 1 spray into both nostrils daily. 12/25/15  Yes Historical Provider, MD  Eszopiclone 3 MG TABS Take 3 mg by mouth at bedtime as needed. Sleep. 12/25/15  Yes Historical Provider, MD  pantoprazole (PROTONIX) 40 MG tablet Take 40 mg by mouth 2 (two) times daily.    Yes Historical Provider, MD  tadalafil (CIALIS) 5 MG tablet Take 1 tablet by mouth daily as needed. 10/31/14  Yes Historical Provider, MD  Tamsulosin HCl (FLOMAX) 0.4 MG CAPS Take 0.4 mg by mouth at bedtime. Takes as directed 05/09/12  Yes Historical Provider, MD  TESTIM 50 MG/5GM GEL Place 5 g onto the skin daily. Takes as directed 05/21/12  Yes Historical Provider, MD  Tetrahydrozoline HCl (VISINE OP) Apply 1-2 drops to eye daily as needed (dry eyes.).   Yes Historical Provider, MD  ciprofloxacin (CIPRO) 500 MG tablet Take 1 tablet (500 mg total) by mouth every 12 (twelve) hours. Patient not taking: Reported on 12/25/2015 08/04/14   Tanna Furry, MD  docusate sodium (COLACE) 100 MG capsule Take 1 capsule (100 mg total) by mouth every 12 (twelve) hours. Patient not taking: Reported on 12/25/2015 08/04/14   Tanna Furry, MD  HYDROcodone-acetaminophen (NORCO/VICODIN) 5-325 MG per tablet Take 1 tablet by mouth every 4 (four) hours as needed. 08/04/14   Tanna Furry, MD  metroNIDAZOLE  (FLAGYL) 500 MG tablet Take 1 tablet (500 mg total) by mouth 2 (two) times daily. 08/04/14   Tanna Furry, MD   BP 142/83 mmHg  Pulse 65  Temp(Src) 98.6 F (37 C) (Oral)  Resp 20  SpO2 97% Physical Exam  Constitutional: He appears well-developed and well-nourished. No distress.  Nontoxic appearing  HENT:  Head: Normocephalic and atraumatic.  Eyes: Conjunctivae are normal. Right eye exhibits no discharge. Left eye exhibits no discharge. No scleral icterus.  Neck: Normal range of motion.  Cardiovascular: Normal rate and regular rhythm.   Pulmonary/Chest: Effort normal. No respiratory distress.  Abdominal: Soft. He exhibits no distension. There is no tenderness. There is no rebound and no guarding.  Genitourinary:  Negative hemoccult  Musculoskeletal: Normal range of motion.  Neurological: He is alert. Coordination normal.  Skin: Skin is warm and dry.  No pallor  Psychiatric: He has a normal mood and affect. His behavior is normal.  Nursing note and vitals reviewed.   ED Course  Procedures (including critical care time) Labs Review Labs Reviewed  COMPREHENSIVE METABOLIC PANEL - Abnormal; Notable for the following:    Creatinine, Ser 1.54 (*)    GFR calc non Af Amer 51 (*)    GFR calc Af Amer 59 (*)    All other components within normal limits  CBC - Abnormal; Notable for the following:    Hemoglobin 11.8 (*)    HCT 38.2 (*)    MCV 70.3 (*)    MCH 21.7 (*)    RDW 16.4 (*)    All other components within normal limits  POC OCCULT BLOOD, ED  POC OCCULT BLOOD, ED  TYPE AND SCREEN  ABO/RH    Imaging Review No results found. I have personally reviewed and evaluated these images and lab results as part of my medical decision-making.   EKG Interpretation None      MDM   Final diagnoses:  Dark stools  Gastroesophageal reflux disease, esophagitis presence not specified   50 year old male with past medical history of GERD presenting with dark, tarry stools. Reports  dark stools for the past 3-4 months with positive Hemoccult card at an urgent care. Patient started on iron supplement last week and noted darkening stools since. Hemodynamically stable and nontoxic appearing. Abdomen is soft, nontender without peritoneal signs. Hemoglobin is 11.8. Creatinine 1.54 which appears to be at his baseline. Negative Hemoccult. Patient's darkening stools likely side effect of his new iron supplement. No further workup indicated at this time. Encouraged patient to keep his gastroenterology follow-up appointment in May. Return precautions given in discharge paperwork and discussed with pt at bedside. Pt stable for discharge      Josephina Gip, PA-C 12/25/15 1906  Charlesetta Shanks, MD 12/26/15 1357

## 2015-12-25 NOTE — Discharge Instructions (Signed)
Gastroesophageal Reflux Disease, Adult Normally, food travels down the esophagus and stays in the stomach to be digested. However, when a person has gastroesophageal reflux disease (GERD), food and stomach acid move back up into the esophagus. When this happens, the esophagus becomes sore and inflamed. Over time, GERD can create small holes (ulcers) in the lining of the esophagus.  CAUSES This condition is caused by a problem with the muscle between the esophagus and the stomach (lower esophageal sphincter, or LES). Normally, the LES muscle closes after food passes through the esophagus to the stomach. When the LES is weakened or abnormal, it does not close properly, and that allows food and stomach acid to go back up into the esophagus. The LES can be weakened by certain dietary substances, medicines, and medical conditions, including:  Tobacco use.  Pregnancy.  Having a hiatal hernia.  Heavy alcohol use.  Certain foods and beverages, such as coffee, chocolate, onions, and peppermint. RISK FACTORS This condition is more likely to develop in:  People who have an increased body weight.  People who have connective tissue disorders.  People who use NSAID medicines. SYMPTOMS Symptoms of this condition include:  Heartburn.  Difficult or painful swallowing.  The feeling of having a lump in the throat.  Abitter taste in the mouth.  Bad breath.  Having a large amount of saliva.  Having an upset or bloated stomach.  Belching.  Chest pain.  Shortness of breath or wheezing.  Ongoing (chronic) cough or a night-time cough.  Wearing away of tooth enamel.  Weight loss. Different conditions can cause chest pain. Make sure to see your health care provider if you experience chest pain. DIAGNOSIS Your health care provider will take a medical history and perform a physical exam. To determine if you have mild or severe GERD, your health care provider may also monitor how you respond  to treatment. You may also have other tests, including:  An endoscopy toexamine your stomach and esophagus with a small camera.  A test thatmeasures the acidity level in your esophagus.  A test thatmeasures how much pressure is on your esophagus.  A barium swallow or modified barium swallow to show the shape, size, and functioning of your esophagus. TREATMENT The goal of treatment is to help relieve your symptoms and to prevent complications. Treatment for this condition may vary depending on how severe your symptoms are. Your health care provider may recommend:  Changes to your diet.  Medicine.  Surgery. HOME CARE INSTRUCTIONS Diet  Follow a diet as recommended by your health care provider. This may involve avoiding foods and drinks such as:  Coffee and tea (with or without caffeine).  Drinks that containalcohol.  Energy drinks and sports drinks.  Carbonated drinks or sodas.  Chocolate and cocoa.  Peppermint and mint flavorings.  Garlic and onions.  Horseradish.  Spicy and acidic foods, including peppers, chili powder, curry powder, vinegar, hot sauces, and barbecue sauce.  Citrus fruit juices and citrus fruits, such as oranges, lemons, and limes.  Tomato-based foods, such as red sauce, chili, salsa, and pizza with red sauce.  Fried and fatty foods, such as donuts, french fries, potato chips, and high-fat dressings.  High-fat meats, such as hot dogs and fatty cuts of red and white meats, such as rib eye steak, sausage, ham, and bacon.  High-fat dairy items, such as whole milk, butter, and cream cheese.  Eat small, frequent meals instead of large meals.  Avoid drinking large amounts of liquid with your  meals.  Avoid eating meals during the 2-3 hours before bedtime.  Avoid lying down right after you eat.  Do not exercise right after you eat. General Instructions  Pay attention to any changes in your symptoms.  Take over-the-counter and prescription  medicines only as told by your health care provider. Do not take aspirin, ibuprofen, or other NSAIDs unless your health care provider told you to do so.  Do not use any tobacco products, including cigarettes, chewing tobacco, and e-cigarettes. If you need help quitting, ask your health care provider.  Wear loose-fitting clothing. Do not wear anything tight around your waist that causes pressure on your abdomen.  Raise (elevate) the head of your bed 6 inches (15cm).  Try to reduce your stress, such as with yoga or meditation. If you need help reducing stress, ask your health care provider.  If you are overweight, reduce your weight to an amount that is healthy for you. Ask your health care provider for guidance about a safe weight loss goal.  Keep all follow-up visits as told by your health care provider. This is important. SEEK MEDICAL CARE IF:  You have new symptoms.  You have unexplained weight loss.  You have difficulty swallowing, or it hurts to swallow.  You have wheezing or a persistent cough.  Your symptoms do not improve with treatment.  You have a hoarse voice. SEEK IMMEDIATE MEDICAL CARE IF:  You have pain in your arms, neck, jaw, teeth, or back.  You feel sweaty, dizzy, or light-headed.  You have chest pain or shortness of breath.  You vomit and your vomit looks like blood or coffee grounds.  You faint.  Your stool is bloody or black.  You cannot swallow, drink, or eat.   This information is not intended to replace advice given to you by your health care provider. Make sure you discuss any questions you have with your health care provider.   Document Released: 06/10/2005 Document Revised: 05/22/2015 Document Reviewed: 12/26/2014 Elsevier Interactive Patient Education 2016 [place] for Gastroesophageal Reflux Disease, Adult When you have gastroesophageal reflux disease (GERD), the foods you eat and your eating habits are very important.  Choosing the right foods can help ease your discomfort.  WHAT GUIDELINES DO I NEED TO FOLLOW?   Choose fruits, vegetables, whole grains, and low-fat dairy products.   Choose low-fat meat, fish, and poultry.  Limit fats such as oils, salad dressings, butter, nuts, and avocado.   Keep a food diary. This helps you identify foods that cause symptoms.   Avoid foods that cause symptoms. These may be different for everyone.   Eat small meals often instead of 3 large meals a day.   Eat your meals slowly, in a place where you are relaxed.   Limit fried foods.   Cook foods using methods other than frying.   Avoid drinking alcohol.   Avoid drinking large amounts of liquids with your meals.   Avoid bending over or lying down until 2-3 hours after eating.  WHAT FOODS ARE NOT RECOMMENDED?  These are some foods and drinks that may make your symptoms worse: Vegetables Tomatoes. Tomato juice. Tomato and spaghetti sauce. Chili peppers. Onion and garlic. Horseradish. Fruits Oranges, grapefruit, and lemon (fruit and juice). Meats High-fat meats, fish, and poultry. This includes hot dogs, ribs, ham, sausage, salami, and bacon. Dairy Whole milk and chocolate milk. Sour cream. Cream. Butter. Ice cream. Cream cheese.  Drinks Coffee and tea. Bubbly (carbonated) drinks or energy  drinks. Condiments Hot sauce. Barbecue sauce.  Sweets/Desserts Chocolate and cocoa. Donuts. Peppermint and spearmint. Fats and Oils High-fat foods. This includes [place] fries and potato chips. Other Vinegar. Strong spices. This includes black pepper, white pepper, red pepper, cayenne, curry powder, cloves, ginger, and chili powder. The items listed above may not be a complete list of foods and drinks to avoid. Contact your dietitian for more information.   This information is not intended to replace advice given to you by your health care provider. Make sure you discuss any questions you have with your health  care provider.   Document Released: 03/01/2012 Document Revised: 09/21/2014 Document Reviewed: 07/05/2013 Elsevier Interactive Patient Education [employer].

## 2015-12-25 NOTE — ED Notes (Signed)
Delay lab dfraw, PA in room at this time.

## 2015-12-25 NOTE — ED Notes (Signed)
Delay in lab draw, tech getting ekg and pharmacy tech updating med list

## 2016-01-24 ENCOUNTER — Ambulatory Visit (INDEPENDENT_AMBULATORY_CARE_PROVIDER_SITE_OTHER): Payer: BLUE CROSS/BLUE SHIELD | Admitting: Internal Medicine

## 2016-01-24 ENCOUNTER — Encounter: Payer: Self-pay | Admitting: Internal Medicine

## 2016-01-24 VITALS — BP 110/78 | HR 76 | Ht 67.25 in | Wt 221.1 lb

## 2016-01-24 DIAGNOSIS — R195 Other fecal abnormalities: Secondary | ICD-10-CM

## 2016-01-24 DIAGNOSIS — K219 Gastro-esophageal reflux disease without esophagitis: Secondary | ICD-10-CM

## 2016-01-24 DIAGNOSIS — R1013 Epigastric pain: Secondary | ICD-10-CM

## 2016-01-24 DIAGNOSIS — D509 Iron deficiency anemia, unspecified: Secondary | ICD-10-CM | POA: Diagnosis not present

## 2016-01-24 MED ORDER — NA SULFATE-K SULFATE-MG SULF 17.5-3.13-1.6 GM/177ML PO SOLN
1.0000 | Freq: Once | ORAL | Status: DC
Start: 2016-01-24 — End: 2016-03-04

## 2016-01-24 NOTE — Patient Instructions (Signed)
You have been scheduled for an endoscopy and colonoscopy. Please follow the written instructions given to you at your visit today. If you use inhalers (even only as needed), please bring them with you on the day of your procedure.    I WILL CALL YOU WHEN I GET SOME SUPREP SAMPLES IN THE OFFICE.

## 2016-01-24 NOTE — Progress Notes (Signed)
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 The patient reports developing significant fatigue proximally 6 months ago. This despite wearing CPAP for sleep apnea. Let see his primary care provider who performed blood work which demonstrated iron deficiency anemia. Patient was placed on iron therapy. He tells me that he was evaluated about 2-1/2 months ago and was noted to have Hemoccult-positive stool. Around that time he was having mid abdominal pain. He rarely uses NSAIDs. He has been on chronic PPI in the form of pantoprazole 40 mg twice daily since being evaluated by Dr. Sharlett Iles in 2013. In 2013, October, he underwent screening colonoscopy secondary to family history in his mother and upper endoscopy for history of GERD. Colonoscopy was normal except for mild sigmoid diverticulosis. Follow-up in 5 years recommended. Upper endoscopy was normal without Barrett's. Small hiatal hernia present. In 2 years on PPI. Has not been seen since. The patient tells me that his fatigue is improving. However he was having dark stools for which she went to the emergency room 12/25/2015. I have reviewed that encounter as well as relevant laboratories and x-rays. Stool at that time was Hemoccult negative. Comprehensive metabolic panel was unremarkable. CBC demonstrated microcytic anemia with hemoglobin of 11.8 MCV 70.3. Normal white blood cell count and platelets. CT of the abdomen and pelvis November [id number] revealed uncomplicated diverticulitis and hepatic steatosis as well as incidental gallstone.  REVIEW OF SYSTEMS:  All non-GI ROS negative except for sinus and allergy, fatigue, shortness of breath  Past Medical History  Diagnosis Date  . Hiatal hernia 2007    egd  . Unspecified  gastritis and gastroduodenitis without mention of hemorrhage 2007    egd  . GERD (gastroesophageal reflux disease)   . Family history of colon cancer     Mother   . Anxiety   . Allergy   . Ulcer   . Hepatic steatosis   . Gallstone   . Diverticulitis   . Diverticulosis     Past Surgical History  Procedure Laterality Date  . Elbow surgery    . Wisdom tooth extraction      Social History Ronald Mcintosh  reports that he has quit smoking. He has never used smokeless tobacco. He reports that he does not drink alcohol or use illicit drugs.  family history includes Cancer in his mother; Colon cancer (age of onset: 60) in his mother.  No Known Allergies     PHYSICAL EXAMINATION: Vital signs: BP 110/78 mmHg  Pulse 76  Ht 5' 7.25" (1.708 m)  Wt 221 lb 2 oz (100.302 kg)  BMI 34.38 kg/m2  Constitutional: generally well-appearing, no acute distress Psychiatric: alert and oriented x3, cooperative Eyes: extraocular movements intact, anicteric, conjunctiva pink Mouth: oral pharynx moist, no lesions Neck: suppleWithout thyromegaly Lymph: no lymphadenopathy Cardiovascular: heart regular rate and rhythm, no murmur Lungs: clear to auscultation bilaterally Abdomen: soft, nontender, nondistended, no obvious ascites, no peritoneal signs, normal bowel sounds, no organomegaly Rectal:Deferred until colonoscopy. Hemoccult negative at recent ER evaluation Extremities: no clubbing cyanosis or lower extremity edema bilaterally Skin: no lesions on visible extremities Neuro: No focal deficits. Normal DTRs. Cranial nerves intact No asterixis.  ASSESSMENT:  #1. Iron deficiency anemia. Rule out GI mucosal lesion #2. Hemoccult-positive stool. Rule out GI mucosal lesion #3. Transient problems  with abdominal pain of uncertain etiology. Does have gallstone on prior imaging #4. Cholelithiasis #5. Upper endoscopy and colonoscopy as described October 2013 #6. GERD #7. Family history of colon cancer in  the patient's mother   PLAN:  #1. Colonoscopy and upper endoscopy to evaluate iron deficiency anemia and Hemoccult-positive stool.The nature of the procedure, as well as the risks, benefits, and alternatives were carefully and thoroughly reviewed with the patient. Ample time for discussion and questions allowed. The patient understood, was satisfied, and agreed to proceed. #2. If endoscopic evaluations unrevealing then capsule endoscopy.The nature of the procedure, as well as the risks, benefits, and alternatives were carefully and thoroughly reviewed with the patient. Ample time for discussion and questions allowed. The patient understood, was satisfied, and agreed to proceed. #3. Continue iron therapy but hold 1 week prior to procedures to assist with preparation #4. Continue PPI for GERD  A copy of this consultation note has been sent to Heide Scales, NP

## 2016-02-01 ENCOUNTER — Emergency Department (HOSPITAL_COMMUNITY)
Admission: EM | Admit: 2016-02-01 | Discharge: 2016-02-01 | Disposition: A | Discharge status: Home / Self Care | Payer: BLUE CROSS/BLUE SHIELD | Attending: Emergency Medicine | Admitting: Emergency Medicine

## 2016-02-01 ENCOUNTER — Encounter (HOSPITAL_COMMUNITY): Payer: Self-pay | Admitting: Emergency Medicine

## 2016-02-01 ENCOUNTER — Emergency Department (HOSPITAL_COMMUNITY): Payer: BLUE CROSS/BLUE SHIELD

## 2016-02-01 DIAGNOSIS — Z79899 Other long term (current) drug therapy: Secondary | ICD-10-CM | POA: Insufficient documentation

## 2016-02-01 DIAGNOSIS — R5383 Other fatigue: Secondary | ICD-10-CM | POA: Diagnosis present

## 2016-02-01 DIAGNOSIS — R5382 Chronic fatigue, unspecified: Secondary | ICD-10-CM | POA: Diagnosis not present

## 2016-02-01 DIAGNOSIS — Z87891 Personal history of nicotine dependence: Secondary | ICD-10-CM | POA: Insufficient documentation

## 2016-02-01 LAB — HEPATIC FUNCTION PANEL
ALK PHOS: 43 U/L (ref 38–126)
ALT: 48 U/L (ref 17–63)
AST: 37 U/L (ref 15–41)
Albumin: 4.3 g/dL (ref 3.5–5.0)
BILIRUBIN INDIRECT: 0.3 mg/dL (ref 0.3–0.9)
BILIRUBIN TOTAL: 0.5 mg/dL (ref 0.3–1.2)
Bilirubin, Direct: 0.2 mg/dL (ref 0.1–0.5)
TOTAL PROTEIN: 7 g/dL (ref 6.5–8.1)

## 2016-02-01 LAB — CBC
HCT: 45 % (ref 39.0–52.0)
Hemoglobin: 14.6 g/dL (ref 13.0–17.0)
MCH: 24.6 pg — AB (ref 26.0–34.0)
MCHC: 32.4 g/dL (ref 30.0–36.0)
MCV: 75.8 fL — AB (ref 78.0–100.0)
PLATELETS: 214 10*3/uL (ref 150–400)
RBC: 5.94 MIL/uL — AB (ref 4.22–5.81)
RDW: 20.4 % — ABNORMAL HIGH (ref 11.5–15.5)
WBC: 5.3 10*3/uL (ref 4.0–10.5)

## 2016-02-01 LAB — LIPASE, BLOOD: LIPASE: 32 U/L (ref 11–51)

## 2016-02-01 LAB — TSH: TSH: 1.591 u[IU]/mL (ref 0.350–4.500)

## 2016-02-01 LAB — URINALYSIS, ROUTINE W REFLEX MICROSCOPIC
BILIRUBIN URINE: NEGATIVE
GLUCOSE, UA: NEGATIVE mg/dL
HGB URINE DIPSTICK: NEGATIVE
Ketones, ur: NEGATIVE mg/dL
Leukocytes, UA: NEGATIVE
Nitrite: NEGATIVE
PROTEIN: NEGATIVE mg/dL
SPECIFIC GRAVITY, URINE: 1.017 (ref 1.005–1.030)
pH: 7.5 (ref 5.0–8.0)

## 2016-02-01 LAB — BASIC METABOLIC PANEL
Anion gap: 7 (ref 5–15)
BUN: 14 mg/dL (ref 6–20)
CALCIUM: 9.2 mg/dL (ref 8.9–10.3)
CHLORIDE: 107 mmol/L (ref 101–111)
CO2: 26 mmol/L (ref 22–32)
CREATININE: 1.48 mg/dL — AB (ref 0.61–1.24)
GFR calc Af Amer: >60 mL/min (ref >60)
GFR calc non Af Amer: 53 mL/min — ABNORMAL LOW (ref >60)
Glucose, Bld: 107 mg/dL — ABNORMAL HIGH (ref 65–99)
Potassium: 4.2 mmol/L (ref 3.5–5.1)
SODIUM: 140 mmol/L (ref 135–145)

## 2016-02-01 LAB — TROPONIN I: Troponin I: <0.03 ng/mL (ref <0.031)

## 2016-02-01 NOTE — ED Provider Notes (Signed)
CSN: [id number]     Arrival date & time 02/01/16  1451 History   First MD Initiated Contact with Patient 02/01/16 1617     No chief complaint on file.    (Consider location/radiation/quality/duration/timing/severity/associated sxs/prior Treatment) The history is provided by the patient.  50 year old male with about a 5 month history of generalized weakness and fatigue some mild confusion. Did at one point have a significant anemia has been placed on iron. Has not noticed any blood in his bowel movements lately. Patient is concerned he has a recurrence of the anemia. [facility] does have a primary care doctor in [place]. Patient hasn't colonoscopy scheduled. Patient is not certain whether he's had an endocrine workup as a possible cause. Patient does have a history of sleep apnea and is on C Pap.  The patient denies fevers chest pain or respiratory symptoms shortness of breath nausea vomiting or diarrhea. Any visual changes speech problems any focal weakness.  Past Medical History  Diagnosis Date  . Hiatal hernia 2007    egd  . Unspecified gastritis and gastroduodenitis without mention of hemorrhage 2007    egd  . GERD (gastroesophageal reflux disease)   . Family history of colon cancer     Mother   . Anxiety   . Allergy   . Ulcer   . Hepatic steatosis   . Gallstone   . Diverticulitis   . Diverticulosis    Past Surgical History  Procedure Laterality Date  . Elbow surgery    . Wisdom tooth extraction     Family History  Problem Relation Age of Onset  . Colon cancer Mother 8  . Cancer Mother    Social History  Substance Use Topics  . Smoking status: Former Research scientist (life sciences)  . Smokeless tobacco: Never Used  . Alcohol Use: No     Comment: Stopped drinking 21 yrs ago     Review of Systems  Constitutional: Positive for fatigue. Negative for fever.  HENT: Negative for congestion.   Eyes: Negative for visual disturbance.  Respiratory: Negative for shortness of breath.    Cardiovascular: Negative for chest pain.  Gastrointestinal: Negative for nausea, vomiting, abdominal pain and diarrhea.  Genitourinary: Negative for dysuria.  Musculoskeletal: Negative for back pain and neck pain.  Skin: Negative for rash.  Neurological: Positive for weakness.  Hematological: Does not bruise/bleed easily.  Psychiatric/Behavioral: Negative for confusion.      Allergies  Review of patient's allergies indicates no known allergies.  Home Medications   Prior to Admission medications   Medication Sig Start Date End Date Taking? Authorizing Provider  AMBULATORY NON FORMULARY MEDICATION CPAP at bedtime   Yes Historical Provider, MD  buPROPion (WELLBUTRIN XL) 300 MG 24 hr tablet Take 300 mg by mouth daily.  05/21/12  Yes Historical Provider, MD  cetirizine (ZYRTEC) 10 MG tablet Take 10 mg by mouth at bedtime.    Yes Historical Provider, MD  clonazePAM (KLONOPIN) 0.5 MG tablet Take 0.5 mg by mouth daily as needed for anxiety.    Yes Historical Provider, MD  DYMISTA 137-50 MCG/ACT SUSP Place 1 spray into both nostrils daily. 12/25/15  Yes Historical Provider, MD  IRON PO Take 1 tablet by mouth 3 (three) times daily.   Yes Historical Provider, MD  pantoprazole (PROTONIX) 40 MG tablet Take 40 mg by mouth 2 (two) times daily.    Yes Historical Provider, MD  tadalafil (CIALIS) 5 MG tablet Take 1 tablet by mouth daily as needed. 10/31/14  Yes Historical Provider, MD  Tamsulosin HCl (FLOMAX) 0.4 MG CAPS Take 0.4 mg by mouth at bedtime. Takes as directed 05/09/12  Yes Historical Provider, MD  TESTIM 50 MG/5GM GEL Place 5 g onto the skin daily. Takes as directed 05/21/12  Yes Historical Provider, MD  Tetrahydrozoline HCl (VISINE OP) Apply 1-2 drops to eye daily as needed (dry eyes.).   Yes Historical Provider, MD  Na Sulfate-K Sulfate-Mg Sulf SOLN Take 1 kit by mouth once. Patient not taking: Reported on 02/01/2016 01/24/16   Irene Shipper, MD   BP 134/72 mmHg  Pulse 64  Temp(Src) 98.1 F  (36.7 C) (Oral)  Resp 16  SpO2 98% Physical Exam  Constitutional: He is oriented to person, place, and time. He appears well-developed and well-nourished. No distress.  HENT:  Head: Normocephalic and atraumatic.  Mouth/Throat: Oropharynx is clear and moist.  Eyes: Conjunctivae and EOM are normal. Pupils are equal, round, and reactive to light.  Neck: Normal range of motion. Neck supple.  Cardiovascular: Normal rate, regular rhythm and normal heart sounds.   No murmur heard. Pulmonary/Chest: Effort normal and breath sounds normal. No respiratory distress.  Abdominal: Soft. Bowel sounds are normal. There is no tenderness.  Musculoskeletal: Normal range of motion. He exhibits no edema.  Neurological: He is alert and oriented to person, place, and time. No cranial nerve deficit. He exhibits normal muscle tone. Coordination normal.  Skin: Skin is warm.  Nursing note and vitals reviewed.   ED Course  Procedures (including critical care time) Labs Review Labs Reviewed  BASIC METABOLIC PANEL - Abnormal; Notable for the following:    Glucose, Bld 107 (*)    Creatinine, Ser 1.48 (*)    GFR calc non Af Amer 53 (*)    All other components within normal limits  CBC - Abnormal; Notable for the following:    RBC 5.94 (*)    MCV 75.8 (*)    MCH 24.6 (*)    RDW 20.4 (*)    All other components within normal limits  LIPASE, BLOOD  TROPONIN I  TSH  HEPATIC FUNCTION PANEL  URINALYSIS, ROUTINE W REFLEX MICROSCOPIC (NOT AT [facility])   Results for orders placed or performed during the hospital encounter of 85/63/14  Basic metabolic panel  Result Value Ref Range   Sodium 140 135 - 145 mmol/L   Potassium 4.2 3.5 - 5.1 mmol/L   Chloride 107 101 - 111 mmol/L   CO2 26 22 - 32 mmol/L   Glucose, Bld 107 (H) 65 - 99 mg/dL   BUN 14 6 - 20 mg/dL   Creatinine, Ser 1.48 (H) 0.61 - 1.24 mg/dL   Calcium 9.2 8.9 - 10.3 mg/dL   GFR calc non Af Amer 53 (L) >60 mL/min   GFR calc Af Amer >60 >60 mL/min    Anion gap 7 5 - 15  CBC  Result Value Ref Range   WBC 5.3 4.0 - 10.5 K/uL   RBC 5.94 (H) 4.22 - 5.81 MIL/uL   Hemoglobin 14.6 13.0 - 17.0 g/dL   HCT 45.0 39.0 - 52.0 %   MCV 75.8 (L) 78.0 - 100.0 fL   MCH 24.6 (L) 26.0 - 34.0 pg   MCHC 32.4 30.0 - 36.0 g/dL   RDW 20.4 (H) 11.5 - 15.5 %   Platelets 214 150 - 400 K/uL  Lipase, blood  Result Value Ref Range   Lipase 32 11 - 51 U/L  Troponin I  Result Value Ref Range   Troponin I <0.03 <0.031 ng/mL  TSH  Result Value Ref Range   TSH 1.591 0.350 - 4.500 uIU/mL  Hepatic function panel  Result Value Ref Range   Total Protein 7.0 6.5 - 8.1 g/dL   Albumin 4.3 3.5 - 5.0 g/dL   AST 37 15 - 41 U/L   ALT 48 17 - 63 U/L   Alkaline Phosphatase 43 38 - 126 U/L   Total Bilirubin 0.5 0.3 - 1.2 mg/dL   Bilirubin, Direct 0.2 0.1 - 0.5 mg/dL   Indirect Bilirubin 0.3 0.3 - 0.9 mg/dL  Urinalysis, Routine w reflex microscopic (not at [facility])  Result Value Ref Range   Color, Urine YELLOW YELLOW   APPearance CLEAR CLEAR   Specific Gravity, Urine 1.017 1.005 - 1.030   pH 7.5 5.0 - 8.0   Glucose, UA NEGATIVE NEGATIVE mg/dL   Hgb urine dipstick NEGATIVE NEGATIVE   Bilirubin Urine NEGATIVE NEGATIVE   Ketones, ur NEGATIVE NEGATIVE mg/dL   Protein, ur NEGATIVE NEGATIVE mg/dL   Nitrite NEGATIVE NEGATIVE   Leukocytes, UA NEGATIVE NEGATIVE     Imaging Review Dg Chest 2 View  02/01/2016  CLINICAL DATA:  Fatigue, chest palpitations EXAM: CHEST  2 VIEW COMPARISON:  04/02/2006 chest CT angiogram. FINDINGS: Normal heart size. Normal mediastinal contour. No pneumothorax. No pleural effusion. Lungs appear clear, with no acute consolidative airspace disease and no pulmonary edema. IMPRESSION: No active cardiopulmonary disease. Electronically Signed   By: Ilona Sorrel M.D.   On: 02/01/2016 18:02   I have personally reviewed and evaluated these images and lab results as part of my medical decision-making.   EKG Interpretation   Date/Time:  Saturday Feb 01 2016 17:00:17 EDT Ventricular Rate:  64 PR Interval:  124 QRS Duration: 98 QT Interval:  364 QTC Calculation: 375 R Axis:   55 Text Interpretation:  Sinus rhythm Confirmed by Treasure Ochs  MD, Coralee Edberg  ([id number]) on 02/01/2016 5:09:36 PM      MDM   Final diagnoses:  Chronic fatigue    Patient with a history of fatigue now for about 5 months. Also did have anemia and is on iron supplements. Patient was concerned that he was significant anemia again. However hemoglobin here is markedly [place]ward improved. At 14. Additional workup had no significant abnormality. Patient is on Cipro at 4 sleep apnea is possible that maybe there is a mild adjustment with this but certainly could result in this chronic fatigue. But based on the workup we could do here including a screen of his TSH no significant findings. Patient will follow-up with primary care doctor and also continue with having his follow-up colonoscopy.    Fredia Sorrow, MD 02/01/16 [phone]

## 2016-02-01 NOTE — ED Notes (Addendum)
Pt reports palpitations, feeling confused, and generalized weakness for the past few weeks. Pt thinks it is low iron levels. Pt treated self for stomach ulcer recently. Pt has appointment with gastroenterologist for endoscopy, but could not wait that long. No palpitations at present.

## 2016-02-01 NOTE — Discharge Instructions (Signed)
Workup here without any abnormal findings. Recommend follow back up with your primary care doctor and to go forward with the colonoscopy. No evidence of significant anemia. Thyroid-stimulating hormone seems to be normal. Chest x-ray without any acute findings. EKG without any acute findings and no evidence of a silent heart attack. Other labs also normal. Symptoms could be related to poor sleep pattern since there is a history of sleep apnea. Just a suggestion.

## 2016-02-20 ENCOUNTER — Telehealth: Payer: Self-pay | Admitting: Internal Medicine

## 2016-02-20 ENCOUNTER — Telehealth: Payer: Self-pay

## 2016-02-20 NOTE — Telephone Encounter (Signed)
Patient is going to come pick up sample.

## 2016-02-20 NOTE — Telephone Encounter (Signed)
Spoke with patient and told him I would leave a suprep sample up front for him.  Patient agreed.

## 2016-02-21 ENCOUNTER — Encounter: Payer: Self-pay | Admitting: Internal Medicine

## 2016-03-04 ENCOUNTER — Ambulatory Visit (AMBULATORY_SURGERY_CENTER): Payer: BLUE CROSS/BLUE SHIELD | Admitting: Internal Medicine

## 2016-03-04 ENCOUNTER — Encounter: Payer: Self-pay | Admitting: Internal Medicine

## 2016-03-04 VITALS — BP 119/68 | HR 65 | Temp 98.9°F | Resp 13 | Ht 67.0 in | Wt 221.0 lb

## 2016-03-04 DIAGNOSIS — R195 Other fecal abnormalities: Secondary | ICD-10-CM | POA: Diagnosis not present

## 2016-03-04 DIAGNOSIS — D509 Iron deficiency anemia, unspecified: Secondary | ICD-10-CM | POA: Diagnosis present

## 2016-03-04 DIAGNOSIS — K317 Polyp of stomach and duodenum: Secondary | ICD-10-CM | POA: Diagnosis not present

## 2016-03-04 DIAGNOSIS — D122 Benign neoplasm of ascending colon: Secondary | ICD-10-CM | POA: Diagnosis not present

## 2016-03-04 MED ORDER — SODIUM CHLORIDE 0.9 % IV SOLN: 500.0000 mL | INTRAVENOUS | Status: DC

## 2016-03-04 NOTE — Op Note (Addendum)
[facility] Patient Name: Ronald Mcintosh Procedure Date: 03/04/2016 1:15 PM MRN: [id number] Endoscopist: Docia Chuck. Henrene Pastor , MD Age: 50 Referring MD:  Date of Birth: 1966-07-24 Gender: Male Account #: [id number] Procedure:                Colonoscopy, with cold snare polypectomy x 1 Indications:              Iron deficiency anemia Medicines:                Monitored Anesthesia Care Procedure:                Pre-Anesthesia Assessment:                           - Prior to the procedure, a History and Physical                            was performed, and patient medications and                            allergies were reviewed. The patient's tolerance of                            previous anesthesia was also reviewed. The risks                            and benefits of the procedure and the sedation                            options and risks were discussed with the patient.                            All questions were answered, and informed consent                            was obtained. Prior Anticoagulants: The patient has                            taken no previous anticoagulant or antiplatelet                            agents. ASA Grade Assessment: II - A patient with                            mild systemic disease. After reviewing the risks                            and benefits, the patient was deemed in                            satisfactory condition to undergo the procedure.                           After obtaining informed consent, the colonoscope  was passed under direct vision. Throughout the                            procedure, the patient's blood pressure, pulse, and                            oxygen saturations were monitored continuously. The                            Model CF-HQ190L [id number] scope was introduced                            through the anus and advanced to the the cecum,                            identified  by appendiceal orifice and ileocecal                            valve. The terminal ileum, ileocecal valve,                            appendiceal orifice, and rectum were photographed.                            The quality of the bowel preparation was excellent.                            The colonoscopy was performed without difficulty.                            The patient tolerated the procedure well. The bowel                            preparation used was SUPREP. Scope In: 1:40:49 PM Scope Out: 1:52:48 PM Scope Withdrawal Time: 0 hours 10 minutes 33 seconds  Total Procedure Duration: 0 hours 11 minutes 59 seconds  Findings:                 A 4 mm polyp was found in the ascending colon. The                            polyp was sessile. The polyp was removed with a                            cold snare. Resection and retrieval were complete.                           Multiple diverticula were found in the hepatic                            flexure and left colon.                           The exam was otherwise without abnormality on  direct and retroflexion views.                           The terminal ileum appeared normal. Complications:            No immediate complications. Estimated blood loss:                            None. Estimated Blood Loss:     Estimated blood loss: none. Impression:               - One 4 mm polyp in the ascending colon, removed                            with a cold snare. Resected and retrieved.                           - Diverticulosis at the hepatic flexure and in the                            left colon.                           - The examination was otherwise normal on direct                            and retroflexion views.                           - The examined portion of the ileum was normal. Recommendation:           - Repeat colonoscopy in 5 years for surveillance.                           - EGD today.  Please see results and final                            recommendations.                           - Await pathology results. Docia Chuck. Henrene Pastor, MD 03/04/2016 [id number] PM This report has been signed electronically. CC Letter to:             Imagene Riches

## 2016-03-04 NOTE — Op Note (Addendum)
[facility] Patient Name: Ronald Mcintosh Procedure Date: 03/04/2016 1:55 PM MRN: [id number] Endoscopist: Docia Chuck. Henrene Pastor , MD Age: 50 Referring MD:  Date of Birth: 1966/07/15 Gender: Male Account #: [id number] Procedure:                Upper GI endoscopy, with biopsies Indications:              Iron deficiency anemia Medicines:                Monitored Anesthesia Care Procedure:                Pre-Anesthesia Assessment:                           - Prior to the procedure, a History and Physical                            was performed, and patient medications and                            allergies were reviewed. The patient's tolerance of                            previous anesthesia was also reviewed. The risks                            and benefits of the procedure and the sedation                            options and risks were discussed with the patient.                            All questions were answered, and informed consent                            was obtained. Prior Anticoagulants: The patient has                            taken no previous anticoagulant or antiplatelet                            agents. ASA Grade Assessment: II - A patient with                            mild systemic disease. After reviewing the risks                            and benefits, the patient was deemed in                            satisfactory condition to undergo the procedure.                           - Prior to the procedure, a History and Physical  was performed, and patient medications and                            allergies were reviewed. The patient's tolerance of                            previous anesthesia was also reviewed. The risks                            and benefits of the procedure and the sedation                            options and risks were discussed with the patient.                            All questions were answered,  and informed consent                            was obtained. Prior Anticoagulants: The patient has                            taken no previous anticoagulant or antiplatelet                            agents. ASA Grade Assessment: II - A patient with                            mild systemic disease. After reviewing the risks                            and benefits, the patient was deemed in                            satisfactory condition to undergo the procedure.                           After obtaining informed consent, the endoscope was                            passed under direct vision. Throughout the                            procedure, the patient's blood pressure, pulse, and                            oxygen saturations were monitored continuously. The                            Model GIF-HQ190 [id number] scope was introduced                            through the mouth, and advanced to the second part  of duodenum. The upper GI endoscopy was                            accomplished without difficulty. The patient                            tolerated the procedure well. Scope In: Scope Out: Findings:                 The examined esophagus was normal.                           Multiple less than 5 mm pedunculated polyps were                            found in the gastric body. Biopsies were taken with                            a cold forceps for histology.                           The exam of the stomach was otherwise normal.                            Unremarkable retroflexion of the stomach.                           The examined duodenum was normal. Biopsies for                            histology were taken with a cold forceps for                            evaluation of celiac disease. Complications:            No immediate complications. Estimated Blood Loss:     Estimated blood loss: none. Impression:               - Normal esophagus.                            - Multiple gastric polyps. Biopsied.                           - Normal examined duodenum. Biopsied. Recommendation:           - My office will contact you to schedule capsule                            endoscopy "iron deficiency anemia, evaluate the                            small bowel".                           - Await pathology results. Docia Chuck. Henrene Pastor, MD 03/04/2016 2:14:20 PM This report has been signed electronically. CC Letter to:  Imagene Riches

## 2016-03-04 NOTE — Progress Notes (Signed)
Teeth unchanged after procedure.A and O x3. Report to RN. Tolerated MAC anesthesia well. 

## 2016-03-04 NOTE — Progress Notes (Signed)
Called to room to assist during endoscopic procedure.  Patient ID and intended procedure confirmed with present staff. Received instructions for my participation in the procedure from the performing physician.  

## 2016-03-04 NOTE — Patient Instructions (Signed)

## 2016-03-05 ENCOUNTER — Telehealth: Payer: Self-pay | Admitting: *Deleted

## 2016-03-05 NOTE — Telephone Encounter (Signed)
Unable to leave message voicemail has not been set up. Attempted x2 to call [phone].

## 2016-03-09 ENCOUNTER — Encounter: Payer: Self-pay | Admitting: Internal Medicine

## 2016-03-11 ENCOUNTER — Other Ambulatory Visit: Payer: Self-pay

## 2016-03-11 ENCOUNTER — Telehealth: Payer: Self-pay

## 2016-03-11 DIAGNOSIS — R195 Other fecal abnormalities: Secondary | ICD-10-CM

## 2016-03-11 DIAGNOSIS — D509 Iron deficiency anemia, unspecified: Secondary | ICD-10-CM

## 2016-03-11 NOTE — Telephone Encounter (Signed)
Pt scheduled for capsule endo 03/30/16@8am . Prep reviewed with pt over the phone and mailed to pt.

## 2016-03-30 ENCOUNTER — Ambulatory Visit (INDEPENDENT_AMBULATORY_CARE_PROVIDER_SITE_OTHER): Payer: BLUE CROSS/BLUE SHIELD | Admitting: Internal Medicine

## 2016-03-30 DIAGNOSIS — R195 Other fecal abnormalities: Secondary | ICD-10-CM

## 2016-03-30 DIAGNOSIS — D509 Iron deficiency anemia, unspecified: Secondary | ICD-10-CM

## 2016-03-30 NOTE — Progress Notes (Signed)
Pt here for capsule endo. Pt completed prep without difficulty. Pt tolerated procedure well.   Capsule [id number] Exp:  06/05/17

## 2016-04-08 ENCOUNTER — Encounter: Payer: Self-pay | Admitting: *Deleted

## 2016-04-08 ENCOUNTER — Telehealth: Payer: Self-pay | Admitting: *Deleted

## 2016-04-08 DIAGNOSIS — D509 Iron deficiency anemia, unspecified: Secondary | ICD-10-CM

## 2016-04-08 NOTE — Telephone Encounter (Signed)
Per Nicoletta Ba, PA, Negative capsule endo. Continue oral iron. Follow up CBC in 2 months prior to 2 month f/u OV with Dr. Henrene Pastor. Unable to reach patient d/t no mail box. Will try again later.

## 2016-04-08 NOTE — Telephone Encounter (Signed)
Patient given results and recommendations. Patient states he is not taking po iron at this time and does not want to take it. He did schedule a f/u OV on 06/09/16 at 3:15 PM and will come a week prior to labs. Lab in [facility].

## 2016-04-09 ENCOUNTER — Encounter: Payer: Self-pay | Admitting: Internal Medicine

## 2016-06-09 ENCOUNTER — Ambulatory Visit: Payer: BLUE CROSS/BLUE SHIELD | Admitting: Internal Medicine

## 2016-09-13 IMAGING — CR DG CHEST 2V
2 series · 2 of 2 positions shown · non-contrast
Comparison: 04/02/2006 chest CT angiogram.

CLINICAL DATA: Fatigue, chest palpitations

EXAM:
CHEST  2 VIEW

[w chest lat]
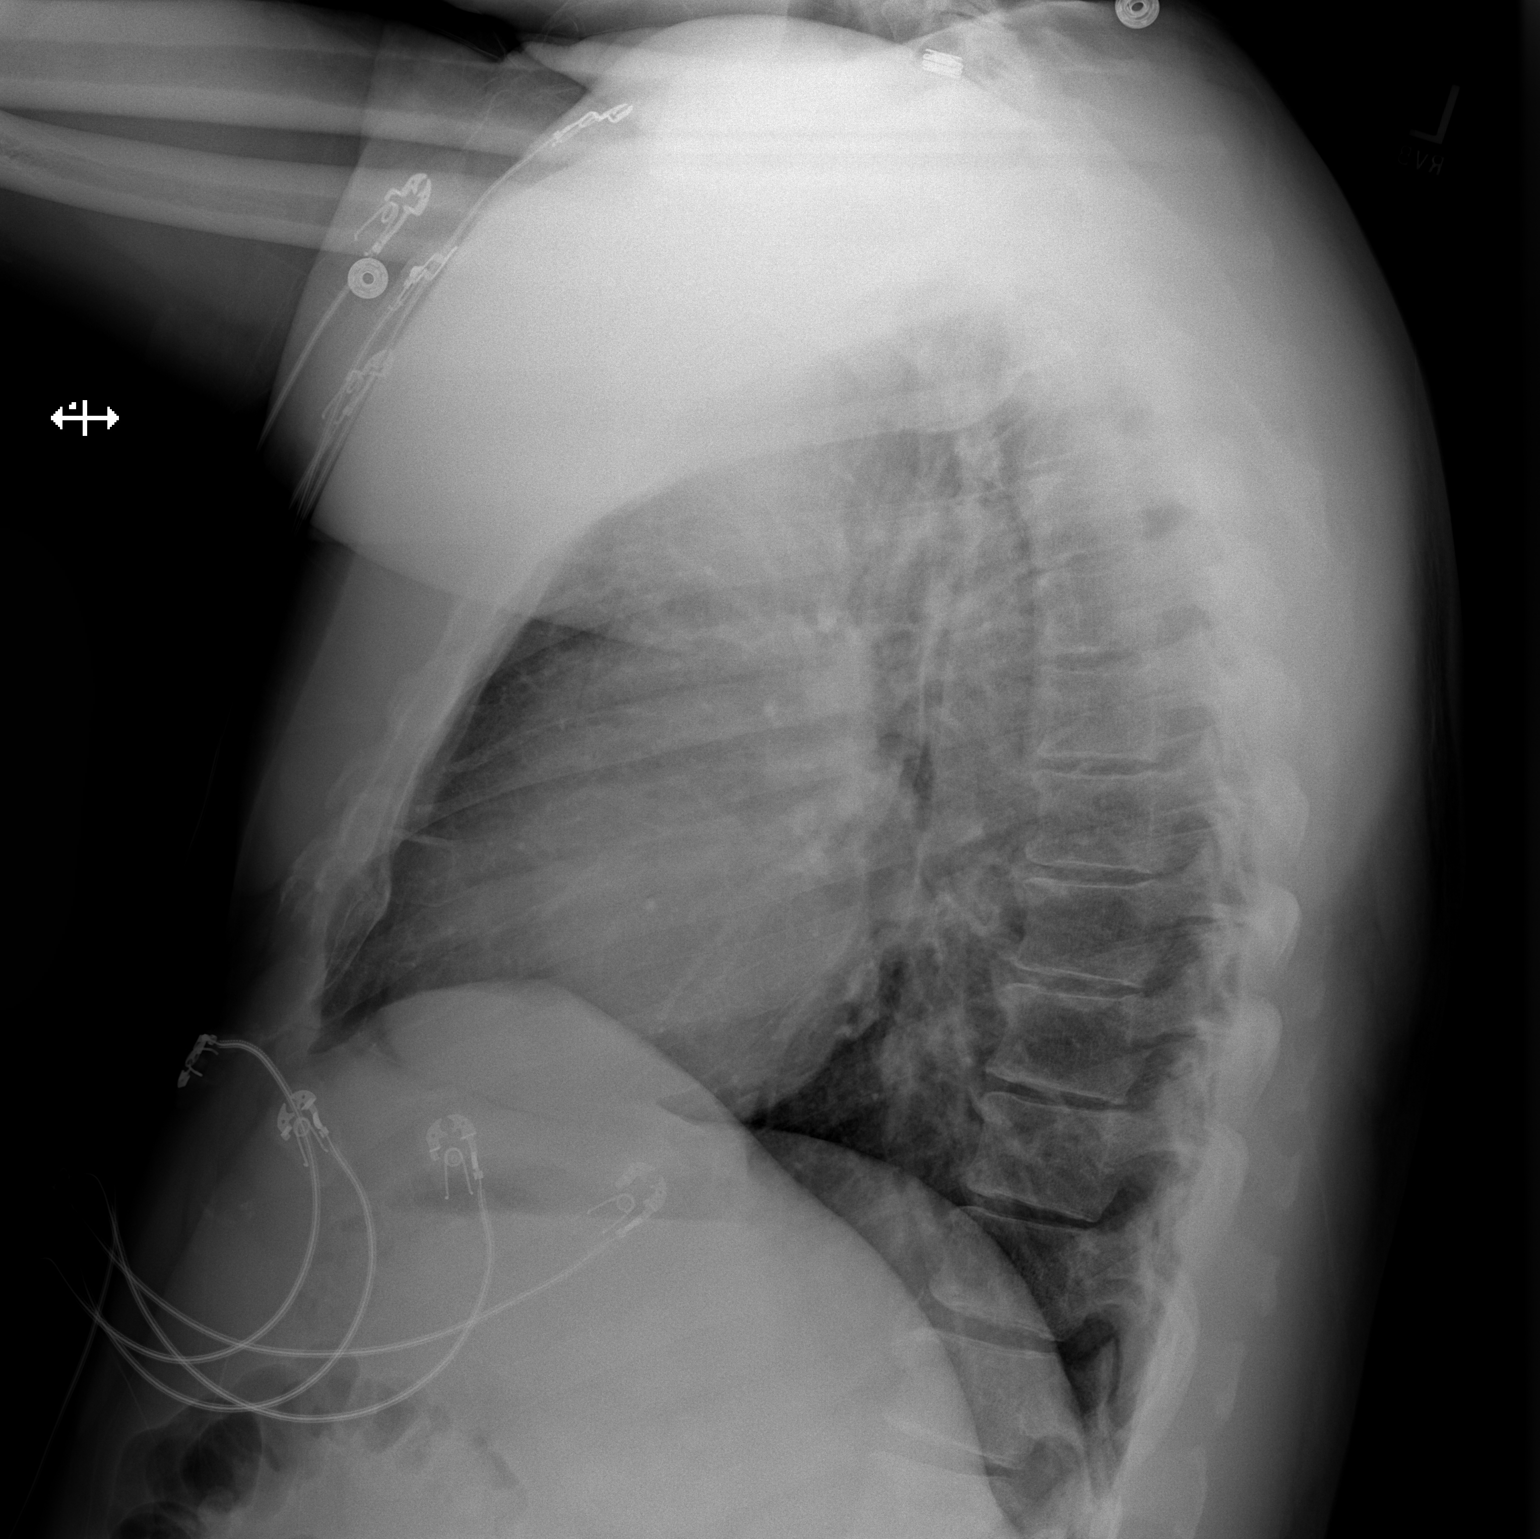

[w chest pa]
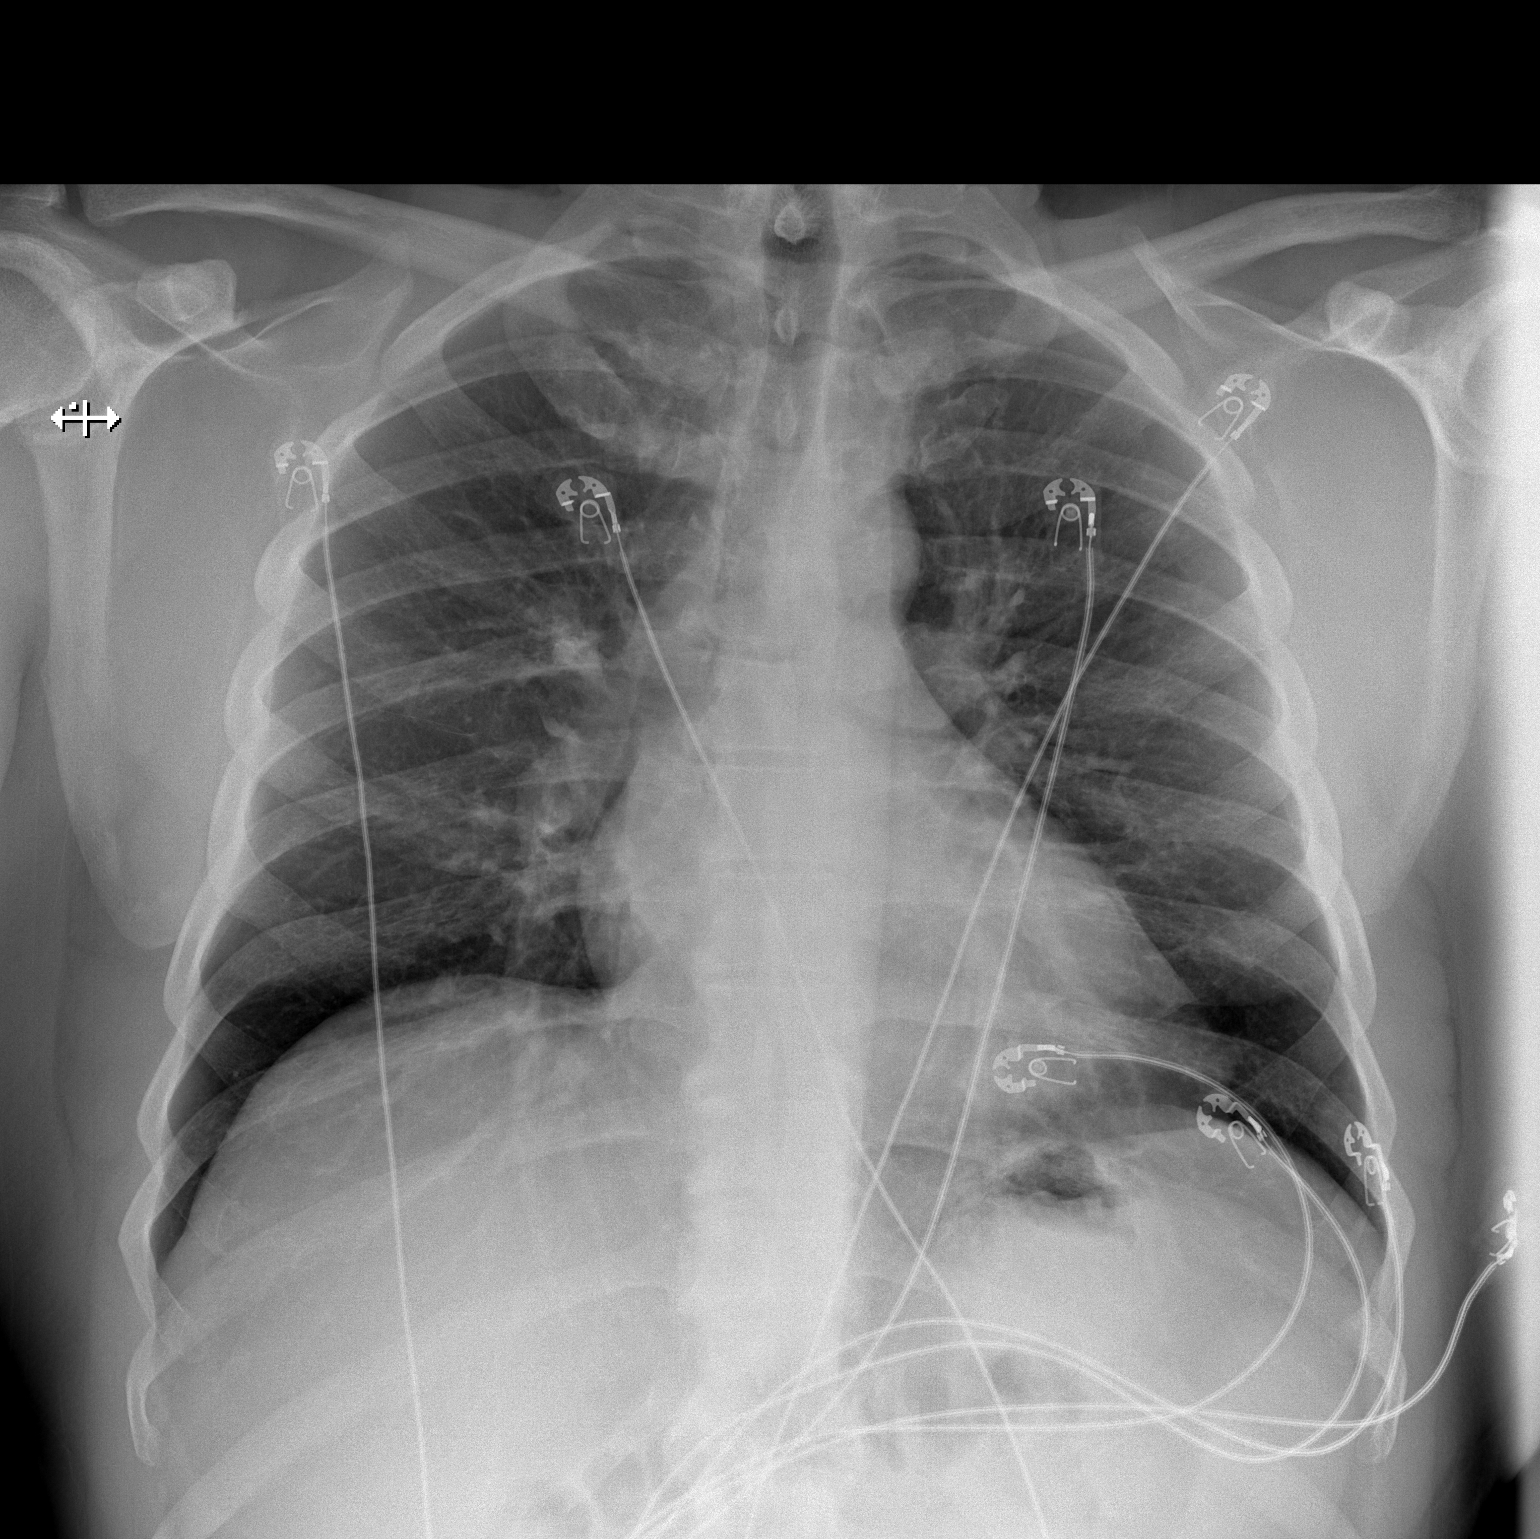

[2 of 2 positions shown; findings below may reference images not displayed]

FINDINGS: Normal heart size. Normal mediastinal contour. No pneumothorax. No
pleural effusion. Lungs appear clear, with no acute consolidative
airspace disease and no pulmonary edema.
IMPRESSION: No active cardiopulmonary disease.

## 2017-09-17 ENCOUNTER — Other Ambulatory Visit (INDEPENDENT_AMBULATORY_CARE_PROVIDER_SITE_OTHER): Payer: BLUE CROSS/BLUE SHIELD

## 2017-09-17 ENCOUNTER — Encounter: Payer: Self-pay | Admitting: Internal Medicine

## 2017-09-17 ENCOUNTER — Ambulatory Visit: Payer: BLUE CROSS/BLUE SHIELD | Admitting: Internal Medicine

## 2017-09-17 VITALS — BP 118/74 | HR 88 | Wt 207.0 lb

## 2017-09-17 DIAGNOSIS — R109 Unspecified abdominal pain: Secondary | ICD-10-CM

## 2017-09-17 DIAGNOSIS — D509 Iron deficiency anemia, unspecified: Secondary | ICD-10-CM

## 2017-09-17 DIAGNOSIS — R194 Change in bowel habit: Secondary | ICD-10-CM

## 2017-09-17 LAB — CBC WITH DIFFERENTIAL/PLATELET
BASOS ABS: 0.1 10*3/uL (ref 0.0–0.1)
Basophils Relative: 1.4 % (ref 0.0–3.0)
EOS ABS: 0.1 10*3/uL (ref 0.0–0.7)
EOS PCT: 1.8 % (ref 0.0–5.0)
HCT: 43.6 % (ref 39.0–52.0)
HEMOGLOBIN: 14 g/dL (ref 13.0–17.0)
LYMPHS ABS: 1.9 10*3/uL (ref 0.7–4.0)
Lymphocytes Relative: 31.9 % (ref 12.0–46.0)
MCHC: 32.2 g/dL (ref 30.0–36.0)
MCV: 76.3 fl — ABNORMAL LOW (ref 78.0–100.0)
MONO ABS: 0.6 10*3/uL (ref 0.1–1.0)
Monocytes Relative: 9.9 % (ref 3.0–12.0)
NEUTROS PCT: 55 % (ref 43.0–77.0)
Neutro Abs: 3.3 10*3/uL (ref 1.4–7.7)
Platelets: 298 10*3/uL (ref 150.0–400.0)
RBC: 5.72 Mil/uL (ref 4.22–5.81)
RDW: 15.5 % (ref 11.5–15.5)
WBC: 6 10*3/uL (ref 4.0–10.5)

## 2017-09-17 LAB — FERRITIN: Ferritin: 9.3 ng/mL — ABNORMAL LOW (ref 22.0–322.0)

## 2017-09-17 NOTE — Progress Notes (Signed)
HISTORY OF PRESENT ILLNESS:  Ronald Mcintosh is a 52 y.o. male , transportation logistics specialist, with a history of sleep apnea, GERD and a self-reported history of ulcer disease who was evaluated in this office 01/24/2016 regarding iron deficiency anemia and Hemoccult-positive stool. Had previous GI evaluations in this office with Dr. Sharlett Iles as previously outlined. At time of his evaluation hemoglobin was 11.8 with MCV 70.3. He subsequently underwent colonoscopy and upper endoscopy on 03/04/2016. Colonoscopy revealed 1 diminutive ascending colon polyp which was removed and found to be benign colonic mucosa. Follow-up in 5 years recommended secondary to family history. Incidental diverticulosis present. No other abnormalities. The ileum was normal. Upper endoscopy revealed benign-appearing gastric polyps but was otherwise normal. Biopsies confirmed benign fundic gland type polyps. Duodenal biopsies were normal with no evidence for villous atrophy. He was placed on iron therapy and set up for capsule endoscopy which was performed 03/30/2016. The examination was a complete study with good preparation. Rapid capsule transit. Examination of the small bowel is negative. He was told to continue iron therapy and follow-up in the office in 2 months. He did neither. He presents himself at this time with chief complaints of altered bowel habits and abdominal discomfort. He states that he was having absolutely no issues until approximately 4 weeks ago when after completing 2 courses of antibiotics for sinus infection develop somewhat loose stools which he described as green. This concerned him. His abdominal discomfort was fullness with borborygmi. No upper abdominal pain. He is known to have gallstones. She tells me that he has changed his diet to a bland diet and has been given "supplements" which have helped his symptoms. He does tell me that he feels fatigued and wonders if his anemia has returned . Review of  outside radiology includes previous ultrasound 2011 and abdominal CT scan 2015 demonstrating hepatic steatosis and gallstones. Prior history of non-complicated diverticulitis  REVIEW OF SYSTEMS:  All non-GI ROS negative except for fatigue, sinus allergies  Past Medical History:  Diagnosis Date  . Allergy   . Anxiety   . Diverticulitis   . Diverticulosis   . Family history of colon cancer    Mother   . Gallstone   . GERD (gastroesophageal reflux disease)   . Hepatic steatosis   . Hiatal hernia 2007   egd  . Obstructive sleep apnea   . Ulcer   . Unspecified gastritis and gastroduodenitis without mention of hemorrhage 2007   egd    Past Surgical History:  Procedure Laterality Date  . ELBOW SURGERY    . WISDOM TOOTH EXTRACTION      Social History DEMETRIA LIGHTSEY  reports that he has quit smoking. he has never used smokeless tobacco. He reports that he does not drink alcohol or use drugs.  family history includes Cancer in his mother; Colon cancer (age of onset: 11) in his mother.  No Known Allergies     PHYSICAL EXAMINATION: Vital signs: BP 118/74   Pulse 88   Wt 207 lb (93.9 kg)   BMI 32.42 kg/m   Constitutional: generally well-appearing, no acute distress Psychiatric: alert and oriented x3, cooperative Eyes: extraocular movements intact, anicteric, conjunctiva pink Mouth: oral pharynx moist, no lesions Neck: supple no lymphadenopathy Cardiovascular: heart regular rate and rhythm, no murmur Lungs: clear to auscultation bilaterally Abdomen: soft, nontender, nondistended, no obvious ascites, no peritoneal signs, normal bowel sounds, no organomegaly Rectal: Omitted Extremities: no lower extremity edema bilaterally Skin: no lesions on visible extremities Neuro: No  focal deficits. Cranial nerves intact  ASSESSMENT:  #1. Alteration in bowel habits, stool color, and transient abdominal discomfort as described. Improving. Issues most consistent with antibiotic-related  GI disturbance #2. History of iron deficiency anemia. Extensive GI workup as described #3. Family history of colon cancer #4. History of diverticulitis  PLAN:  #1. Probiotic align one daily for 4 weeks. We have provided him with samples #2. CBC and ferritin today to rule out recurrent iron deficiency anemia #3. Routine surveillance colonoscopy due around June 2022 #4. Resume general medical care with PCP  25 minutes spent face-to-face with the patient  50% a time use for counseling regarding his concerns over altered bowel habits, abdominal discomfort, and possible recurrent iron deficiency anemia. Patient had multiple questions which were answered to his satisfaction

## 2017-09-17 NOTE — Patient Instructions (Signed)
Your physician has requested that you go to the basement for the following lab work before leaving today:  CBC, Ferritin  Take one Align a day for a month

## 2018-11-07 DIAGNOSIS — J301 Allergic rhinitis due to pollen: Secondary | ICD-10-CM | POA: Insufficient documentation

## 2019-05-03 DIAGNOSIS — F329 Major depressive disorder, single episode, unspecified: Secondary | ICD-10-CM | POA: Insufficient documentation

## 2020-05-26 DIAGNOSIS — F411 Generalized anxiety disorder: Secondary | ICD-10-CM | POA: Insufficient documentation

## 2020-12-25 DIAGNOSIS — S46311A Strain of muscle, fascia and tendon of triceps, right arm, initial encounter: Secondary | ICD-10-CM | POA: Insufficient documentation

## 2021-02-04 DIAGNOSIS — Z4789 Encounter for other orthopedic aftercare: Secondary | ICD-10-CM | POA: Insufficient documentation

## 2021-03-12 ENCOUNTER — Encounter: Payer: Self-pay | Admitting: Internal Medicine

## 2021-07-01 DIAGNOSIS — R079 Chest pain, unspecified: Secondary | ICD-10-CM | POA: Insufficient documentation

## 2021-07-09 DIAGNOSIS — J309 Allergic rhinitis, unspecified: Secondary | ICD-10-CM | POA: Insufficient documentation

## 2021-07-09 DIAGNOSIS — R030 Elevated blood-pressure reading, without diagnosis of hypertension: Secondary | ICD-10-CM | POA: Insufficient documentation

## 2021-08-04 DIAGNOSIS — J22 Unspecified acute lower respiratory infection: Secondary | ICD-10-CM | POA: Insufficient documentation

## 2021-08-05 DIAGNOSIS — I10 Essential (primary) hypertension: Secondary | ICD-10-CM | POA: Insufficient documentation

## 2022-06-04 DIAGNOSIS — R42 Dizziness and giddiness: Secondary | ICD-10-CM | POA: Insufficient documentation

## 2022-06-04 DIAGNOSIS — R5383 Other fatigue: Secondary | ICD-10-CM | POA: Insufficient documentation

## 2022-07-08 ENCOUNTER — Encounter: Payer: Self-pay | Admitting: Physician Assistant

## 2022-07-09 ENCOUNTER — Encounter: Payer: Self-pay | Admitting: Internal Medicine

## 2022-07-09 ENCOUNTER — Telehealth: Payer: Self-pay | Admitting: Internal Medicine

## 2022-07-09 NOTE — Telephone Encounter (Signed)
Iron is low but Hbg is normal at 14.7 that was drawn on 10/13. Please see note below and advise regarding urgency of appt.

## 2022-07-09 NOTE — Telephone Encounter (Signed)
Urgent referral in [facility] for anemia.  Please advise urgency and scheduling.

## 2022-07-09 NOTE — Telephone Encounter (Signed)
Not urgent.  Has been worked up for the same previously

## 2022-07-09 NOTE — Telephone Encounter (Signed)
Patient is scheduled for 08/19/22 with Dr. Henrene Pastor and aware of this appointment

## 2022-08-11 DIAGNOSIS — E611 Iron deficiency: Secondary | ICD-10-CM | POA: Insufficient documentation

## 2022-08-11 DIAGNOSIS — N1831 Chronic kidney disease, stage 3a: Secondary | ICD-10-CM | POA: Insufficient documentation

## 2022-08-19 ENCOUNTER — Encounter: Payer: Self-pay | Admitting: Internal Medicine

## 2022-08-19 ENCOUNTER — Ambulatory Visit (INDEPENDENT_AMBULATORY_CARE_PROVIDER_SITE_OTHER): Payer: Self-pay | Admitting: Internal Medicine

## 2022-08-19 VITALS — BP 130/84 | HR 68 | Ht 67.0 in | Wt 205.1 lb

## 2022-08-19 DIAGNOSIS — Z86010 Personal history of colonic polyps: Secondary | ICD-10-CM

## 2022-08-19 DIAGNOSIS — R1011 Right upper quadrant pain: Secondary | ICD-10-CM

## 2022-08-19 DIAGNOSIS — D509 Iron deficiency anemia, unspecified: Secondary | ICD-10-CM

## 2022-08-19 DIAGNOSIS — K219 Gastro-esophageal reflux disease without esophagitis: Secondary | ICD-10-CM

## 2022-08-19 DIAGNOSIS — K8020 Calculus of gallbladder without cholecystitis without obstruction: Secondary | ICD-10-CM

## 2022-08-19 MED ORDER — NA SULFATE-K SULFATE-MG SULF 17.5-3.13-1.6 GM/177ML PO SOLN: 1.0000 | Freq: Once | ORAL | 0 refills | Status: AC

## 2022-08-19 NOTE — Progress Notes (Signed)
HISTORY OF PRESENT ILLNESS:  Ronald Mcintosh is a 56 y.o. male, employed in transportation logistics, with a history of iron deficiency anemia for which she has undergone prior extensive work-up, diverticulitis, and a family history of colon cancer.  He is sent today by his primary care provider regarding iron deficiency anemia and the need for follow-up endoscopic evaluation.  Patient was last seen in this office January 2019.  See that dictation for details.  He underwent work-up for iron deficiency anemia in June 2017.  This included colonoscopy and upper endoscopy.  Colonoscopy revealed diverticulosis and a nonadenomatous colon polyp.  The terminal ileum was normal.  Follow-up in 5 years due to family history recommended.  Upper endoscopy was unremarkable.  Duodenal biopsies normal.  Subsequent capsule endoscopy unrevealing.  He was placed on iron supplementation with resolution of his iron deficiency anemia.  Patient is overdue for his follow-up colonoscopy.  He tells me that he was feeling a bit dizzy and fatigued.  He saw his PCP and was found to have mild anemia with low iron levels.  He was placed on iron supplement.  His anemia resolved.  Iron levels still low.  Last hemoglobin from 7 days ago 15.7 with MCV 76.3 and ferritin 7 with iron saturation 9%.  Patient does report to me several episodes of epigastric pain after meals.  1 lasted about 2 hours.  There was also discomfort the right upper quadrant.  He is known to have gallstones on remote CT scan.  He is concerned about his gallbladder.  Currently asymptomatic.  REVIEW OF SYSTEMS:  All non-GI ROS negative unless otherwise stated in the HPI except for fatigue  Past Medical History:  Diagnosis Date   Allergy    Anemia    Anxiety    Colon polyps    Diverticulitis    Diverticulosis    Family history of colon cancer    Mother    Gallstone    GERD (gastroesophageal reflux disease)    Hepatic steatosis    Hiatal hernia 09/14/2005   egd    Hx of blood clots    Obstructive sleep apnea    Ulcer    Unspecified gastritis and gastroduodenitis without mention of hemorrhage 09/14/2005   egd    Past Surgical History:  Procedure Laterality Date   ELBOW SURGERY     WISDOM TOOTH EXTRACTION      Social History Ronald Mcintosh  reports that he has quit smoking. He has never used smokeless tobacco. He reports that he does not drink alcohol and does not use drugs.  family history includes Cancer in his mother; Colon cancer (age of onset: 62) in his mother.  No Known Allergies     PHYSICAL EXAMINATION: Vital signs: BP 130/84   Pulse 68   Ht '5\' 7"'$  (1.702 m)   Wt 205 lb 2 oz (93 kg)   BMI 32.13 kg/m   Constitutional: generally well-appearing, no acute distress Psychiatric: alert and oriented x3, cooperative Eyes: extraocular movements intact, anicteric, conjunctiva pink Mouth: oral pharynx moist, no lesions Neck: supple no lymphadenopathy Cardiovascular: heart regular rate and rhythm, no murmur Lungs: clear to auscultation bilaterally Abdomen: soft, nontender, nondistended, no obvious ascites, no peritoneal signs, normal bowel sounds, no organomegaly Rectal: Deferred to colonoscopy Extremities: no clubbing, cyanosis, or lower extremity edema bilaterally Skin: no lesions on visible extremities Neuro: No focal deficits.  Cranial nerves intact  ASSESSMENT:  1.  Mild iron deficiency anemia.  Resolved with supplementation.  Previous  evaluation for same unrevealing.  Suspect small bowel AVMs not noted capsule endoscopy as the most likely source.  On iron supplementation. 2.  Family history of colon cancer in his mother around age 82 3.  History of diverticulitis 4.  Recurrent epigastric right upper quadrant pain.  Rule out gallstones.  Rule out upper GI mucosal lesions 5.  GERD.  On pantoprazole 6.  Gallstones on prior CT  PLAN:  1.  Schedule colonoscopy to evaluate recurrent iron deficiency anemia and provide colon  cancer screening in a patient with colon cancer in his mother.The nature of the procedure, as well as the risks, benefits, and alternatives were carefully and thoroughly reviewed with the patient. Ample time for discussion and questions allowed. The patient understood, was satisfied, and agreed to proceed. 2.  Schedule upper endoscopy to evaluate iron deficiency anemia and upper abdominal pain.The nature of the procedure, as well as the risks, benefits, and alternatives were carefully and thoroughly reviewed with the patient. Ample time for discussion and questions allowed. The patient understood, was satisfied, and agreed to proceed. 3.  Continue PPI 4.  Schedule abdominal ultrasound to evaluate abdominal pain. 5.  Continue daily iron supplementation. 6.  Ongoing general medical care with PCP. 7.  If upper endoscopy unrevealing and episodic upper abdominal pain persist, then surgical referral for consideration of laparoscopic cholecystectomy A total time of 60 minutes was spent preparing to see the patient, reviewing outside data, obtaining comprehensive interval history, performing medically appropriate physical examination, counseling and educating the patient regarding the above listed issues, ordering advanced radiology study, ordering multiple endoscopic procedures, and documenting clinical information in the health record.

## 2022-08-19 NOTE — Patient Instructions (Signed)
_______________________________________________________  If you are age 57 or older, your body mass index should be between 23-30. Your Body mass index is 32.13 kg/m. If this is out of the aforementioned range listed, please consider follow up with your Primary Care Provider.  If you are age 84 or younger, your body mass index should be between 19-25. Your Body mass index is 32.13 kg/m. If this is out of the aformentioned range listed, please consider follow up with your Primary Care Provider.   ________________________________________________________  The [facility] providers would like to encourage you to use [facility] to communicate with providers for non-urgent requests or questions.  Due to long hold times on the telephone, sending your provider a message by [facility] may be a faster and more efficient way to get a response.  Please allow 48 business hours for a response.  Please remember that this is for non-urgent requests.  _______________________________________________________  Dennis Bast will be contacted by [facility] Scheduling in the next 2 days to arrange an abdominal ultrasound.  The number on your caller ID will be [phone], please answer when they call.  If you have not heard from them in 2 days please call [phone] to schedule.

## 2022-10-16 ENCOUNTER — Encounter: Payer: Self-pay | Admitting: Internal Medicine

## 2022-10-16 ENCOUNTER — Ambulatory Visit (AMBULATORY_SURGERY_CENTER): Payer: BLUE CROSS/BLUE SHIELD | Admitting: Internal Medicine

## 2022-10-16 VITALS — BP 123/78 | HR 76 | Temp 97.5°F | Resp 16 | Ht 67.0 in | Wt 205.0 lb

## 2022-10-16 DIAGNOSIS — D509 Iron deficiency anemia, unspecified: Secondary | ICD-10-CM

## 2022-10-16 DIAGNOSIS — R1013 Epigastric pain: Secondary | ICD-10-CM

## 2022-10-16 DIAGNOSIS — Z800 Family history of malignant neoplasm of digestive organs: Secondary | ICD-10-CM

## 2022-10-16 DIAGNOSIS — K5730 Diverticulosis of large intestine without perforation or abscess without bleeding: Secondary | ICD-10-CM | POA: Diagnosis not present

## 2022-10-16 DIAGNOSIS — K219 Gastro-esophageal reflux disease without esophagitis: Secondary | ICD-10-CM | POA: Diagnosis not present

## 2022-10-16 DIAGNOSIS — R1011 Right upper quadrant pain: Secondary | ICD-10-CM

## 2022-10-16 DIAGNOSIS — Z860100 Personal history of colon polyps, unspecified: Secondary | ICD-10-CM

## 2022-10-16 DIAGNOSIS — K449 Diaphragmatic hernia without obstruction or gangrene: Secondary | ICD-10-CM | POA: Diagnosis not present

## 2022-10-16 MED ORDER — SODIUM CHLORIDE 0.9 % IV SOLN: 500.0000 mL | Freq: Once | INTRAVENOUS | Status: DC

## 2022-10-16 NOTE — Patient Instructions (Signed)
Resume all of your previous medications today.  Read all of the handouts given to you by your recovery room nurse.  YOU HAD AN ENDOSCOPIC PROCEDURE TODAY AT [facility]:   Refer to the procedure report that was given to you for any specific questions about what was found during the examination.  If the procedure report does not answer your questions, please call your gastroenterologist to clarify.  If you requested that your care partner not be given the details of your procedure findings, then the procedure report has been included in a sealed envelope for you to review at your convenience later.  YOU SHOULD EXPECT: Some feelings of bloating in the abdomen. Passage of more gas than usual.  Walking can help get rid of the air that was put into your GI tract during the procedure and reduce the bloating. If you had a lower endoscopy (such as a colonoscopy or flexible sigmoidoscopy) you may notice spotting of blood in your stool or on the toilet paper. If you underwent a bowel prep for your procedure, you may not have a normal bowel movement for a few days.  Please Note:  You might notice some irritation and congestion in your nose or some drainage.  This is from the oxygen used during your procedure.  There is no need for concern and it should clear up in a day or so.  SYMPTOMS TO REPORT IMMEDIATELY:  Following lower endoscopy (colonoscopy or flexible sigmoidoscopy):  Excessive amounts of blood in the stool  Significant tenderness or worsening of abdominal pains  Swelling of the abdomen that is new, acute  Fever of 100F or higher  Following upper endoscopy (EGD)  Vomiting of blood or coffee ground material  New chest pain or pain under the shoulder blades  Painful or persistently difficult swallowing  New shortness of breath  Fever of 100F or higher  Black, tarry-looking stools  For urgent or emergent issues, a gastroenterologist can be reached at any hour by calling [phone]  [phone]. Do not use MyChart messaging for urgent concerns.    DIET:  We do recommend a small meal at first, but then you may proceed to your regular diet.  Drink plenty of fluids but you should avoid alcoholic beverages for 24 hours.  Try to increase the fiber in your diet, and drink   ACTIVITY:  You should plan to take it easy for the rest of today and you should NOT DRIVE or use heavy machinery until tomorrow (because of the sedation medicines used during the test).    FOLLOW UP: Our staff will call the number listed on your records the next business day following your procedure.  We will call around 7:15- 8:00 am to check on you and address any questions or concerns that you may have regarding the information given to you following your procedure. If we do not reach you, we will leave a message.      SIGNATURES/CONFIDENTIALITY: You and/or your care partner have signed paperwork which will be entered into your electronic medical record.  These signatures attest to the fact that that the information above on your After Visit Summary has been reviewed and is understood.  Full responsibility of the confidentiality of this discharge information lies with you and/or your care-partner.

## 2022-10-16 NOTE — Progress Notes (Signed)
HISTORY OF PRESENT ILLNESS:   Ronald Mcintosh is a 57 y.o. male, employed in transportation logistics, with a history of iron deficiency anemia for which she has undergone prior extensive work-up, diverticulitis, and a family history of colon cancer.  He is sent today by his primary care provider regarding iron deficiency anemia and the need for follow-up endoscopic evaluation.  Patient was last seen in this office January 2019.  See that dictation for details.   He underwent work-up for iron deficiency anemia in June 2017.  This included colonoscopy and upper endoscopy.  Colonoscopy revealed diverticulosis and a nonadenomatous colon polyp.  The terminal ileum was normal.  Follow-up in 5 years due to family history recommended.  Upper endoscopy was unremarkable.  Duodenal biopsies normal.  Subsequent capsule endoscopy unrevealing.  He was placed on iron supplementation with resolution of his iron deficiency anemia.  Patient is overdue for his follow-up colonoscopy.  He tells me that he was feeling a bit dizzy and fatigued.  He saw his PCP and was found to have mild anemia with low iron levels.  He was placed on iron supplement.  His anemia resolved.  Iron levels still low.  Last hemoglobin from 7 days ago 15.7 with MCV 76.3 and ferritin 7 with iron saturation 9%.  Patient does report to me several episodes of epigastric pain after meals.  1 lasted about 2 hours.  There was also discomfort the right upper quadrant.  He is known to have gallstones on remote CT scan.  He is concerned about his gallbladder.  Currently asymptomatic.   REVIEW OF SYSTEMS:   All non-GI ROS negative unless otherwise stated in the HPI except for fatigue       Past Medical History:  Diagnosis Date   Allergy     Anemia     Anxiety     Colon polyps     Diverticulitis     Diverticulosis     Family history of colon cancer      Mother    Gallstone     GERD (gastroesophageal reflux disease)     Hepatic steatosis     Hiatal  hernia 09/14/2005    egd   Hx of blood clots     Obstructive sleep apnea     Ulcer     Unspecified gastritis and gastroduodenitis without mention of hemorrhage 09/14/2005    egd           Past Surgical History:  Procedure Laterality Date   ELBOW SURGERY       WISDOM TOOTH EXTRACTION          Social History Ronald Mcintosh  reports that he has quit smoking. He has never used smokeless tobacco. He reports that he does not drink alcohol and does not use drugs.   family history includes Cancer in his mother; Colon cancer (age of onset: 90) in his mother.   No Known Allergies       PHYSICAL EXAMINATION: Vital signs: BP 130/84   Pulse 68   Ht '5\' 7"'$  (1.702 m)   Wt 205 lb 2 oz (93 kg)   BMI 32.13 kg/m   Constitutional: generally well-appearing, no acute distress Psychiatric: alert and oriented x3, cooperative Eyes: extraocular movements intact, anicteric, conjunctiva pink Mouth: oral pharynx moist, no lesions Neck: supple no lymphadenopathy Cardiovascular: heart regular rate and rhythm, no murmur Lungs: clear to auscultation bilaterally Abdomen: soft, nontender, nondistended, no obvious ascites, no peritoneal signs, normal bowel sounds, no organomegaly  Rectal: Deferred to colonoscopy Extremities: no clubbing, cyanosis, or lower extremity edema bilaterally Skin: no lesions on visible extremities Neuro: No focal deficits.  Cranial nerves intact   ASSESSMENT:   1.  Mild iron deficiency anemia.  Resolved with supplementation.  Previous evaluation for same unrevealing.  Suspect small bowel AVMs not noted capsule endoscopy as the most likely source.  On iron supplementation. 2.  Family history of colon cancer in his mother around age 62 3.  History of diverticulitis 4.  Recurrent epigastric right upper quadrant pain.  Rule out gallstones.  Rule out upper GI mucosal lesions 5.  GERD.  On pantoprazole 6.  Gallstones on prior CT   PLAN:   1.  Schedule colonoscopy to evaluate  recurrent iron deficiency anemia and provide colon cancer screening in a patient with colon cancer in his mother.The nature of the procedure, as well as the risks, benefits, and alternatives were carefully and thoroughly reviewed with the patient. Ample time for discussion and questions allowed. The patient understood, was satisfied, and agreed to proceed. 2.  Schedule upper endoscopy to evaluate iron deficiency anemia and upper abdominal pain.The nature of the procedure, as well as the risks, benefits, and alternatives were carefully and thoroughly reviewed with the patient. Ample time for discussion and questions allowed. The patient understood, was satisfied, and agreed to proceed. 3.  Continue PPI 4.  Schedule abdominal ultrasound to evaluate abdominal pain. 5.  Continue daily iron supplementation. 6.  Ongoing general medical care with PCP. 7.  If upper endoscopy unrevealing and episodic upper abdominal pain persist, then surgical referral for consideration of laparoscopic cholecystectomy   Tells me that he has had no further problems with pain.  He did not get his ultrasound.  He is now for colonoscopy and upper endoscopy.

## 2022-10-16 NOTE — Op Note (Signed)
[name] Patient Name: Ronald Mcintosh Procedure Date: 10/16/2022 11:20 AM MRN: [id number] Endoscopist: Docia Chuck. Henrene Pastor , MD, [id number] Age: 57 Referring MD:  Date of Birth: Feb 17, 1966 Gender: Male Account #: [id number] Procedure:                Upper GI endoscopy Indications:              Epigastric abdominal pain, Iron deficiency anemia,                            Esophageal reflux Medicines:                Monitored Anesthesia Care Procedure:                Pre-Anesthesia Assessment:                           - Prior to the procedure, a History and Physical                            was performed, and patient medications and                            allergies were reviewed. The patient's tolerance of                            previous anesthesia was also reviewed. The risks                            and benefits of the procedure and the sedation                            options and risks were discussed with the patient.                            All questions were answered, and informed consent                            was obtained. Prior Anticoagulants: The patient has                            taken no anticoagulant or antiplatelet agents. ASA                            Grade Assessment: II - A patient with mild systemic                            disease. After reviewing the risks and benefits,                            the patient was deemed in satisfactory condition to                            undergo the procedure.  After obtaining informed consent, the endoscope was                            passed under direct vision. Throughout the                            procedure, the patient's blood pressure, pulse, and                            oxygen saturations were monitored continuously. The                            GIF [id number] #[id number] was introduced through the                            mouth, and advanced to the third part of  duodenum.                            The upper GI endoscopy was accomplished without                            difficulty. The patient tolerated the procedure                            well. Scope In: Scope Out: Findings:                 The esophagus was normal.                           The stomach was normal. Small hiatal hernia.                           The examined duodenum was normal.                           The cardia and gastric fundus were normal on                            retroflexion. Complications:            No immediate complications. Estimated Blood Loss:     Estimated blood loss: none. Impression:               1. Normal EGD                           2. GERD                           3. Epigastric pain Recommendation:           - Patient has a contact number available for                            emergencies. The signs and symptoms of potential  delayed complications were discussed with the                            patient. Return to normal activities tomorrow.                            Written discharge instructions were provided to the                            patient.                           - Resume previous diet.                           - Continue present medications.                           - Contact the office if you have recurrent problems                            with epigastric pain. We would reschedule the                            abdominal ultrasound to rule out gallstones.                            Otherwise, return to the care of your primary                            provider Docia Chuck. Henrene Pastor, MD 10/16/2022 12:07:19 PM This report has been signed electronically.

## 2022-10-16 NOTE — Progress Notes (Signed)
A and O x3. Report to RN. Tolerated MAC anesthesia well.Teeth unchanged after procedure. 

## 2022-10-16 NOTE — Progress Notes (Signed)
Pt's states no medical or surgical changes since previsit or office visit. VS assessed by N.C ?

## 2022-10-16 NOTE — Op Note (Signed)
[facility] Patient Name: Ronald Mcintosh Procedure Date: 10/16/2022 11:28 AM MRN: [id number] Endoscopist: Docia Chuck. Henrene Pastor , MD, [id number] Age: 57 Referring MD:  Date of Birth: August 25, 1966 Gender: Male Account #: [id number] Procedure:                Colonoscopy Indications:              Iron deficiency anemia. Family history of colon                            cancer in parent age 29. Prior colonoscopies. Most                            recent 2017. Medicines:                Monitored Anesthesia Care Procedure:                Pre-Anesthesia Assessment:                           - Prior to the procedure, a History and Physical                            was performed, and patient medications and                            allergies were reviewed. The patient's tolerance of                            previous anesthesia was also reviewed. The risks                            and benefits of the procedure and the sedation                            options and risks were discussed with the patient.                            All questions were answered, and informed consent                            was obtained. Prior Anticoagulants: The patient has                            taken no anticoagulant or antiplatelet agents. ASA                            Grade Assessment: II - A patient with mild systemic                            disease. After reviewing the risks and benefits,                            the patient was deemed in satisfactory condition to  undergo the procedure.                           After obtaining informed consent, the colonoscope                            was passed under direct vision. Throughout the                            procedure, the patient's blood pressure, pulse, and                            oxygen saturations were monitored continuously. The                            CF [id number] #[id number] was introduced through the  anus                            and advanced to the the cecum, identified by                            appendiceal orifice and ileocecal valve. The                            terminal ileum, ileocecal valve, appendiceal                            orifice, and rectum were photographed. The quality                            of the bowel preparation was excellent. The                            colonoscopy was performed without difficulty. The                            patient tolerated the procedure well. The bowel                            preparation used was SUPREP via split dose                            instruction. Scope In: 11:41:36 AM Scope Out: 11:51:04 AM Scope Withdrawal Time: 0 hours 7 minutes 34 seconds  Total Procedure Duration: 0 hours 9 minutes 28 seconds  Findings:                 The terminal ileum appeared normal.                           Multiple diverticula were found in the left colon.                           The exam was otherwise without abnormality on  direct and retroflexion views. Complications:            No immediate complications. Estimated blood loss:                            None. Estimated Blood Loss:     Estimated blood loss: none. Impression:               - The examined portion of the ileum was normal.                           - Diverticulosis in the left colon.                           - The examination was otherwise normal on direct                            and retroflexion views.                           - No specimens collected. Recommendation:           - Repeat colonoscopy in 5 years for screening                            purposes (family history).                           - Patient has a contact number available for                            emergencies. The signs and symptoms of potential                            delayed complications were discussed with the                            patient.  Return to normal activities tomorrow.                            Written discharge instructions were provided to the                            patient.                           - Resume previous diet.                           - Continue present medications.                           - EGD today. Please see report Docia Chuck. Henrene Pastor, MD 10/16/2022 12:03:45 PM This report has been signed electronically.

## 2022-10-19 ENCOUNTER — Telehealth: Payer: Self-pay

## 2022-10-19 NOTE — Telephone Encounter (Signed)
Post procedure follow up call, no answer 

## 2022-10-27 DIAGNOSIS — Z0000 Encounter for general adult medical examination without abnormal findings: Secondary | ICD-10-CM | POA: Insufficient documentation

## 2023-06-02 DIAGNOSIS — R7303 Prediabetes: Secondary | ICD-10-CM | POA: Insufficient documentation

## 2023-09-22 ENCOUNTER — Encounter: Payer: Self-pay | Admitting: Cardiology

## 2023-09-23 ENCOUNTER — Ambulatory Visit: Payer: BLUE CROSS/BLUE SHIELD | Attending: Cardiology | Admitting: Cardiology

## 2023-09-23 ENCOUNTER — Encounter: Payer: Self-pay | Admitting: Cardiology

## 2023-09-23 VITALS — BP 134/90 | HR 79 | Ht 67.0 in | Wt 210.8 lb

## 2023-09-23 DIAGNOSIS — Z86711 Personal history of pulmonary embolism: Secondary | ICD-10-CM | POA: Diagnosis not present

## 2023-09-23 DIAGNOSIS — R7303 Prediabetes: Secondary | ICD-10-CM

## 2023-09-23 DIAGNOSIS — N1831 Chronic kidney disease, stage 3a: Secondary | ICD-10-CM

## 2023-09-23 DIAGNOSIS — R0609 Other forms of dyspnea: Secondary | ICD-10-CM

## 2023-09-23 DIAGNOSIS — I10 Essential (primary) hypertension: Secondary | ICD-10-CM

## 2023-09-23 NOTE — Patient Instructions (Signed)
 Medication Instructions:  Your physician recommends that you continue on your current medications as directed. Please refer to the Current Medication list given to you today.  *If you need a refill on your cardiac medications before your next appointment, please call your pharmacy*   Lab Work: None Ordered If you have labs (blood work) drawn today and your tests are completely normal, you will receive your results only by: MyChart Message (if you have MyChart) OR A paper copy in the mail If you have any lab test that is abnormal or we need to change your treatment, we will call you to review the results.   Testing/Procedures: Your physician has requested that you have an echocardiogram. Echocardiography is a painless test that uses sound waves to create images of your heart. It provides your doctor with information about the size and shape of your heart and how well your heart's chambers and valves are working. This procedure takes approximately one hour. There are no restrictions for this procedure. Please do NOT wear cologne, perfume, aftershave, or lotions (deodorant is allowed). Please arrive 15 minutes prior to your appointment time.  Please note: We ask at that you not bring children with you during ultrasound (echo/ vascular) testing. Due to room size and safety concerns, children are not allowed in the ultrasound rooms during exams. Our front office staff cannot provide observation of children in our lobby area while testing is being conducted. An adult accompanying a patient to their appointment will only be allowed in the ultrasound room at the discretion of the ultrasound technician under special circumstances. We apologize for any inconvenience.    Follow-Up: At [facility], you and your health needs are our priority.  As part of our continuing mission to provide you with exceptional heart care, we have created designated Provider Care Teams.  These Care Teams include your  primary Cardiologist (physician) and Advanced Practice Providers (APPs -  Physician Assistants and Nurse Practitioners) who all work together to provide you with the care you need, when you need it.  We recommend signing up for the patient portal called "MyChart".  Sign up information is provided on this After Visit Summary.  MyChart is used to connect with patients for Virtual Visits (Telemedicine).  Patients are able to view lab/test results, encounter notes, upcoming appointments, etc.  Non-urgent messages can be sent to your provider as well.   To learn more about what you can do with MyChart, go to [email or web address].    Your next appointment:   2 month(s)  The format for your next appointment:   In Person  Provider:   Gypsy Balsam, MD    Other Instructions NA

## 2023-09-23 NOTE — Progress Notes (Signed)
 Cardiology Consultation:    Date:  09/23/2023   ID:  Tanda DELENA Billing, DOB 11/27/1965, MRN [id number]  PCP:  Hunter Mickey Browner, PA-C  Cardiologist:  Lamar Fitch, MD   Referring MD: Silvano Angeline FALCON, NP   Chief Complaint  Patient presents with   hospital folow up    History of Present Illness:    Ronald Mcintosh is a 58 y.o. male who is being seen today for the evaluation of I was in the emergency room with chest pain at the request of Silvano Angeline FALCON, NP.  Past medical history significant for pulmonary emboli which happened 15 years ago felt to be provoked by frequent long traveling, he was on anticoagulation but then being discontinue.  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  Few weeks ago he started having some chest pain located in the left back of his chest worse with taking deep breath coughing he went to hospital think he may have pulmonary emboli, CT chest was done which did not show pulmonary emboli.  He was discharged home.  Since that time he is doing fine gradually coming back to his exercise routine able to walk climb stairs with no difficulty exercise on the regular basis with no trouble.  Denies have any tightness squeezing pressure burning chest.  He used testosterone on the regular basis he is being follow-up by some doctor for that.  His H&H being always elevated from time to time he did not have to have some blood donation to compensate for that.  Does not smoke does not drink alcohol, otherwise overall seems to be doing fine.  Past Medical History:  Diagnosis Date   Allergy    Anemia    Anxiety    Colon polyps    Diverticulitis    Diverticulosis    Family history of colon cancer    Mother    Gallstone    GERD (gastroesophageal reflux disease)    Hepatic steatosis    Hiatal hernia 09/14/2005   egd   Hx of blood clots    Obstructive sleep apnea    Ulcer    Unspecified gastritis  and gastroduodenitis without mention of hemorrhage 09/14/2005   egd    Past Surgical History:  Procedure Laterality Date   ELBOW SURGERY     WISDOM TOOTH EXTRACTION      Current Medications: Current Meds  Medication Sig   anastrozole (ARIMIDEX) 1 MG tablet Take 1 mg by mouth daily.   buPROPion (WELLBUTRIN XL) 300 MG 24 hr tablet Take 300 mg by mouth daily.    diazepam (VALIUM) 5 MG tablet Take 1 tablet by mouth daily as needed for anxiety.   estradiol cypionate (DEPO-ESTRADIOL) 5 MG/ML injection Inject 1.3 mg into the muscle 3 (three) times a week.   fluticasone (FLONASE) 50 MCG/ACT nasal spray Place 1 spray into both nostrils daily.   loratadine (CLARITIN) 10 MG tablet Take 10 mg by mouth daily.   pantoprazole (PROTONIX) 40 MG tablet Take 40 mg by mouth daily.   tadalafil (CIALIS) 5 MG tablet Take 1 tablet by mouth daily as needed for erectile dysfunction.   Tamsulosin HCl (FLOMAX) 0.4 MG CAPS Take 0.4 mg by mouth at bedtime. Takes as directed   Tetrahydrozoline HCl (VISINE OP) Apply 1-2 drops to eye daily as needed (dry eyes.).   [DISCONTINUED] cetirizine (ZYRTEC) 10 MG tablet Take 10 mg by mouth at bedtime.    [  DISCONTINUED] TESTIM 50 MG/5GM GEL Place 5 g onto the skin daily. Takes as directed   [DISCONTINUED] Testosterone 1.62 % GEL Apply 1 application  topically daily.     Allergies:   Patient has no known allergies.   Social History   Socioeconomic History   Marital status: Single    Spouse name: Not on file   Number of children: 2   Years of education: Not on file   Highest education level: Not on file  Occupational History   Occupation: transportation  Tobacco Use   Smoking status: Former   Smokeless tobacco: Never  Vaping Use   Vaping status: Never Used  Substance and Sexual Activity   Alcohol use: No    Comment: Stopped drinking 21 yrs ago    Drug use: No   Sexual activity: Not on file  Other Topics Concern   Not on file  Social History Narrative   Not  on file   Social Drivers of Health   Financial Resource Strain: Low Risk  (07/08/2023)   Received from [facility]   Overall Financial Resource Strain (CARDIA)    Difficulty of Paying Living Expenses: Not hard at all  Food Insecurity: No Food Insecurity (07/08/2023)   [facility]   Hunger Vital Sign    Worried About Running Out of Food in the Last Year: Never true    Ran Out of Food in the Last Year: Never true  Transportation Needs: No Transportation Needs (07/08/2023)   Received from [facility] - Transportation    Lack of Transportation (Medical): No    Lack of Transportation (Non-Medical): No  Physical Activity: Not on file  Stress: Not on file  Social Connections: Unknown (01/27/2022)   Received from [facility]   Social Network    Social Network: Not on file     Family History: The patient's family history includes Cancer in his mother; Colon cancer (age of onset: 58) in his mother. ROS:   Please see the history of present illness.    All 14 point review of systems negative except as described per history of present illness.  EKGs/Labs/Other Studies Reviewed:    The following studies were reviewed today:   EKG:  EKG Interpretation Date/Time:  Thursday September 23 2023 15:07:05 EST Ventricular Rate:  79 PR Interval:  136 QRS Duration:  142 QT Interval:  380 QTC Calculation: 435 R Axis:   52  Text Interpretation: Normal sinus rhythm Right bundle branch block Abnormal ECG When compared with ECG of 01-Feb-2016 17:00, PREVIOUS ECG IS PRESENT Confirmed by Bernie Charleston [phone]) on 09/23/2023 3:19:17 PM    Recent Labs: No results found for requested labs within last 365 days.  Recent Lipid Panel No results found for: CHOL, TRIG, HDL, CHOLHDL, VLDL, LDLCALC, LDLDIRECT  Physical Exam:    VS:  BP (!) 134/90 (BP Location: Right Arm, Patient Position: Sitting)   Pulse 79   Ht 5' 7 (1.702 m)   Wt 210 lb  12.8 oz (95.6 kg)   SpO2 93%   BMI 33.02 kg/m     Wt Readings from Last 3 Encounters:  09/23/23 210 lb 12.8 oz (95.6 kg)  10/16/22 205 lb (93 kg)  08/19/22 205 lb 2 oz (93 kg)     GEN:  Well nourished, well developed in no acute distress HEENT: Normal NECK: No JVD; No carotid bruits LYMPHATICS: No lymphadenopathy CARDIAC: RRR, no murmurs, no rubs, no gallops RESPIRATORY:  Clear  to auscultation without rales, wheezing or rhonchi  ABDOMEN: Soft, non-tender, non-distended MUSCULOSKELETAL:  No edema; No deformity  SKIN: Warm and dry NEUROLOGIC:  Alert and oriented x 3 PSYCHIATRIC:  Normal affect   ASSESSMENT:    1. Primary hypertension   2. Dyspnea on exertion   3. Stage 3a chronic kidney disease (HCC)   4. History of pulmonary embolism   5. Prediabetes    PLAN:    In order of problems listed above:  Chest pain very atypical now better pleuritic component PE negative.  Will continue monitoring.  I do not think this is coronary disease. Right bundle branch block noted on the EKG.  Will get echocardiogram to look at the size of the right ventricle. History of pulmonary emboli.  He is not anticoagulated was felt to be provoked continue monitoring. Overall he is very athletic exercise on the regular basis I think overall he is doing fine.  He tells me his cholesterol is always good but will bring me results When I see him. Elevated blood count which is related probably to testosterone use however the fact that he gets right bundle branch block make me think about potentially having pulmonary hypertension, potentially having sleep apnea as well as potentially still having pulmonary hypertension related to post and PE however that would be unusual so like I suspect it is mostly related to testosterone use   Medication Adjustments/Labs and Tests Ordered: Current medicines are reviewed at length with the patient today.  Concerns regarding medicines are outlined above.  Orders Placed  This Encounter  Procedures   EKG 12-Lead   ECHOCARDIOGRAM COMPLETE   No orders of the defined types were placed in this encounter.   Signed, Lamar DOROTHA Fitch, MD, [facility]. 09/23/2023 3:38 PM    [facility]

## 2023-10-08 NOTE — Addendum Note (Signed)
Addended by: Baldo Ash D on: 10/08/2023 02:41 PM   Modules accepted: Orders

## 2023-10-13 ENCOUNTER — Other Ambulatory Visit: Payer: BLUE CROSS/BLUE SHIELD

## 2023-10-14 ENCOUNTER — Telehealth: Payer: Self-pay | Admitting: Cardiology

## 2023-10-14 NOTE — Telephone Encounter (Signed)
-----   Message from Maryruth Hancock sent at 10/08/2023  1:59 PM EST ----- Regarding: RE: [employer] OON - ECHO R/S Approved auth now on file for Cardiac Ultrasound - [facility] at [facility]   Thank you ----- Message ----- From: Cordie Grice Sent: 10/08/2023   1:30 PM EST To: Georgeanna Lea, MD; Bolivar Haw; # Subject: RE: [employer] OON - ECHO R/S                        Patient was agreeable with first facility- please let me know when it has been ordered.   Cardiac Ultrasound - [address] [facility] [place], [place] [address]  Ph: [phone] ----- Message ----- From: Jules Husbands Sent: 10/06/2023  11:47 AM EST To: Georgeanna Lea, MD; Bolivar Haw; # Subject: [employer] OON - ECHO R/S                            Good Morning,    This patient is scheduled for an echocardiogram on 10/13/23. He has [employer] and the plan will not authorize the echocardiogram request due to [facility]'s facilities being out of network. If the patient decides to move forward with the exam, they will be considered self-pay (will not qualify for the self-pay discount) and the insurance will not be filed. The patient will be responsible for the cost of the exam.    The following facilities are in network for the patient's policy, please advise if the patient would like to switch to any of these locations and the authorization will be submitted.    Cardiac Ultrasound - [address] at [facility] [place], [place] [address] Ph: [phone]  Cardiology - [facility] [address] [place], [place] [address] Ph: [phone]  Cardiology - [facility] [address] [place], [place] [address] Ph: [phone]  Thank you, Otho Bellows.

## 2023-10-14 NOTE — Telephone Encounter (Signed)
Called patient to inquire on whether or not he got his echo scheduled with [facility]- unable to leave message/kbl 10/14/23

## 2023-10-20 NOTE — Telephone Encounter (Signed)
 Pt called in stating the office states they do not have order, they asked it be sent again  Fax: [phone] Attn: Alfonza Angry

## 2023-10-20 NOTE — Telephone Encounter (Signed)
 Sent letter to have [facility] sent order form Alfonza Angry [phone]

## 2023-11-02 NOTE — Telephone Encounter (Signed)
Faxed message to [phone] to send order for to order Echocardiogram for pt

## 2023-11-02 NOTE — Telephone Encounter (Signed)
Received a call from [employer]. She stated we have the wrong fax number.  I looked up [facility], their website shows [phone] as fax number. Please send to this number.

## 2023-11-09 ENCOUNTER — Telehealth: Payer: Self-pay | Admitting: Cardiology

## 2023-11-09 NOTE — Telephone Encounter (Signed)
 Faxed as requested

## 2023-11-09 NOTE — Telephone Encounter (Signed)
 Ask that our office fax over the echo request to [phone]. Please advise

## 2023-11-29 ENCOUNTER — Ambulatory Visit: Payer: BLUE CROSS/BLUE SHIELD | Attending: Cardiology | Admitting: Cardiology
